# Patient Record
Sex: Female | Born: 1990 | Race: Black or African American | Hispanic: No | Marital: Single | Smoking: Never smoker
Health system: Southern US, Community
[De-identification: ages and names within clinical notes are randomized; demographics above are authoritative.]

---

## 2013-04-16 ENCOUNTER — Ambulatory Visit: Payer: Self-pay

## 2013-05-20 ENCOUNTER — Ambulatory Visit: Payer: Self-pay | Admitting: Internal Medicine

## 2014-02-01 ENCOUNTER — Ambulatory Visit (INDEPENDENT_AMBULATORY_CARE_PROVIDER_SITE_OTHER): Payer: Self-pay | Admitting: Emergency Medicine

## 2014-02-01 VITALS — BP 114/76 | HR 68 | Temp 98.0°F | Resp 18 | Ht 61.0 in | Wt 130.6 lb

## 2014-02-01 DIAGNOSIS — Z021 Encounter for pre-employment examination: Secondary | ICD-10-CM

## 2014-02-01 NOTE — Progress Notes (Signed)
[facility] [address], [place] [id number] [phone]  Date:  02/01/2014   Name:  Barbara EatonJustine Moody   DOB:  03/05/1990   MRN:  [id number]  PCP:  No PCP Per Patient    Chief Complaint: Annual Exam   History of Present Illness:  Barbara Moody is a 23 y.o. very pleasant female patient who presents with the following:  Admin physicial   There are no active problems to display for this patient.   History reviewed. No pertinent past medical history.  History reviewed. No pertinent past surgical history.  History  Substance Use Topics  . Smoking status: Never Smoker   . Smokeless tobacco: Never Used  . Alcohol Use: No    History reviewed. No pertinent family history.  No Known Allergies  Medication list has been reviewed and updated.  No current outpatient prescriptions on file prior to visit.   No current facility-administered medications on file prior to visit.    Review of Systems:  As per HPI, otherwise negative.    Physical Examination: Filed Vitals:   02/01/14 0929  BP: 114/76  Pulse: 68  Temp: 98 F (36.7 C)  Resp: 18   Filed Vitals:   02/01/14 0929  Height: 5\' 1"  (1.549 m)  Weight: 130 lb 9.6 oz (59.24 kg)   Body mass index is 24.69 kg/(m^2). Ideal Body Weight: Weight in (lb) to have BMI = 25: 132  GEN: WDWN, NAD, Non-toxic, A & O x 3 HEENT: Atraumatic, Normocephalic. Neck supple. No masses, No LAD. Ears and Nose: No external deformity. CV: RRR, No M/G/R. No JVD. No thrill. No extra heart sounds. PULM: CTA B, no wheezes, crackles, rhonchi. No retractions. No resp. distress. No accessory muscle use. ABD: S, NT, ND, +BS. No rebound. No HSM. EXTR: No c/c/e NEURO Normal gait.  PSYCH: Normally interactive. Conversant. Not depressed or anxious appearing.  Calm demeanor.    Assessment and Plan: DSS physical TBST  Signed,  Phillips OdorJeffery Edda Orea, MD

## 2014-02-01 NOTE — Addendum Note (Signed)
Addended by: Carmelina DaneANDERSON, Beyonca Wisz S on: 02/01/2014 10:12 AM   Modules accepted: Orders

## 2014-02-14 ENCOUNTER — Ambulatory Visit (INDEPENDENT_AMBULATORY_CARE_PROVIDER_SITE_OTHER): Payer: Self-pay | Admitting: Physician Assistant

## 2014-02-14 VITALS — Temp 98.7°F

## 2014-02-14 DIAGNOSIS — Z111 Encounter for screening for respiratory tuberculosis: Secondary | ICD-10-CM

## 2014-02-14 NOTE — Progress Notes (Signed)

## 2014-02-15 NOTE — Addendum Note (Signed)
Addended by: Morrell RiddleWEBER, Eann Cleland L on: 02/15/2014 08:38 AM   Modules accepted: Level of Service

## 2014-02-16 ENCOUNTER — Ambulatory Visit (INDEPENDENT_AMBULATORY_CARE_PROVIDER_SITE_OTHER): Payer: Self-pay

## 2014-02-16 DIAGNOSIS — Z7689 Persons encountering health services in other specified circumstances: Secondary | ICD-10-CM

## 2014-02-16 DIAGNOSIS — Z111 Encounter for screening for respiratory tuberculosis: Secondary | ICD-10-CM

## 2014-02-16 LAB — TB SKIN TEST
Induration: 0 mm
TB Skin Test: NEGATIVE

## 2015-01-04 ENCOUNTER — Ambulatory Visit (INDEPENDENT_AMBULATORY_CARE_PROVIDER_SITE_OTHER): Payer: BLUE CROSS/BLUE SHIELD | Admitting: Family Medicine

## 2015-01-04 ENCOUNTER — Ambulatory Visit (INDEPENDENT_AMBULATORY_CARE_PROVIDER_SITE_OTHER): Payer: BLUE CROSS/BLUE SHIELD

## 2015-01-04 VITALS — BP 118/80 | HR 68 | Temp 98.2°F | Resp 18 | Ht 62.0 in | Wt 140.0 lb

## 2015-01-04 DIAGNOSIS — R079 Chest pain, unspecified: Secondary | ICD-10-CM

## 2015-01-04 DIAGNOSIS — D509 Iron deficiency anemia, unspecified: Secondary | ICD-10-CM

## 2015-01-04 DIAGNOSIS — Z658 Other specified problems related to psychosocial circumstances: Secondary | ICD-10-CM | POA: Diagnosis not present

## 2015-01-04 DIAGNOSIS — F439 Reaction to severe stress, unspecified: Secondary | ICD-10-CM

## 2015-01-04 DIAGNOSIS — F411 Generalized anxiety disorder: Secondary | ICD-10-CM | POA: Diagnosis not present

## 2015-01-04 LAB — POCT CBC
Granulocyte percent: 57.5 %G (ref 37–80)
HCT, POC: 35.1 % — AB (ref 37.7–47.9)
HEMOGLOBIN: 11.6 g/dL — AB (ref 12.2–16.2)
Lymph, poc: 1.8 (ref 0.6–3.4)
MCH, POC: 24.9 pg — AB (ref 27–31.2)
MCHC: 33.2 g/dL (ref 31.8–35.4)
MCV: 75.1 fL — AB (ref 80–97)
MID (cbc): 0.6 (ref 0–0.9)
MPV: 7.5 fL (ref 0–99.8)
POC Granulocyte: 3.2 (ref 2–6.9)
POC LYMPH PERCENT: 32.1 %L (ref 10–50)
POC MID %: 10.4 %M (ref 0–12)
Platelet Count, POC: 172 10*3/uL (ref 142–424)
RBC: 4.67 M/uL (ref 4.04–5.48)
RDW, POC: 14.2 %
WBC: 5.5 10*3/uL (ref 4.6–10.2)

## 2015-01-04 IMAGING — CR DG CHEST 2V
2 series · 2 of 2 positions shown · non-contrast
Comparison: None.

CLINICAL DATA: Chest pain for 1-1/2 weeks. No known injury. Initial
encounter.

EXAM:
CHEST  2 VIEW

[PA]
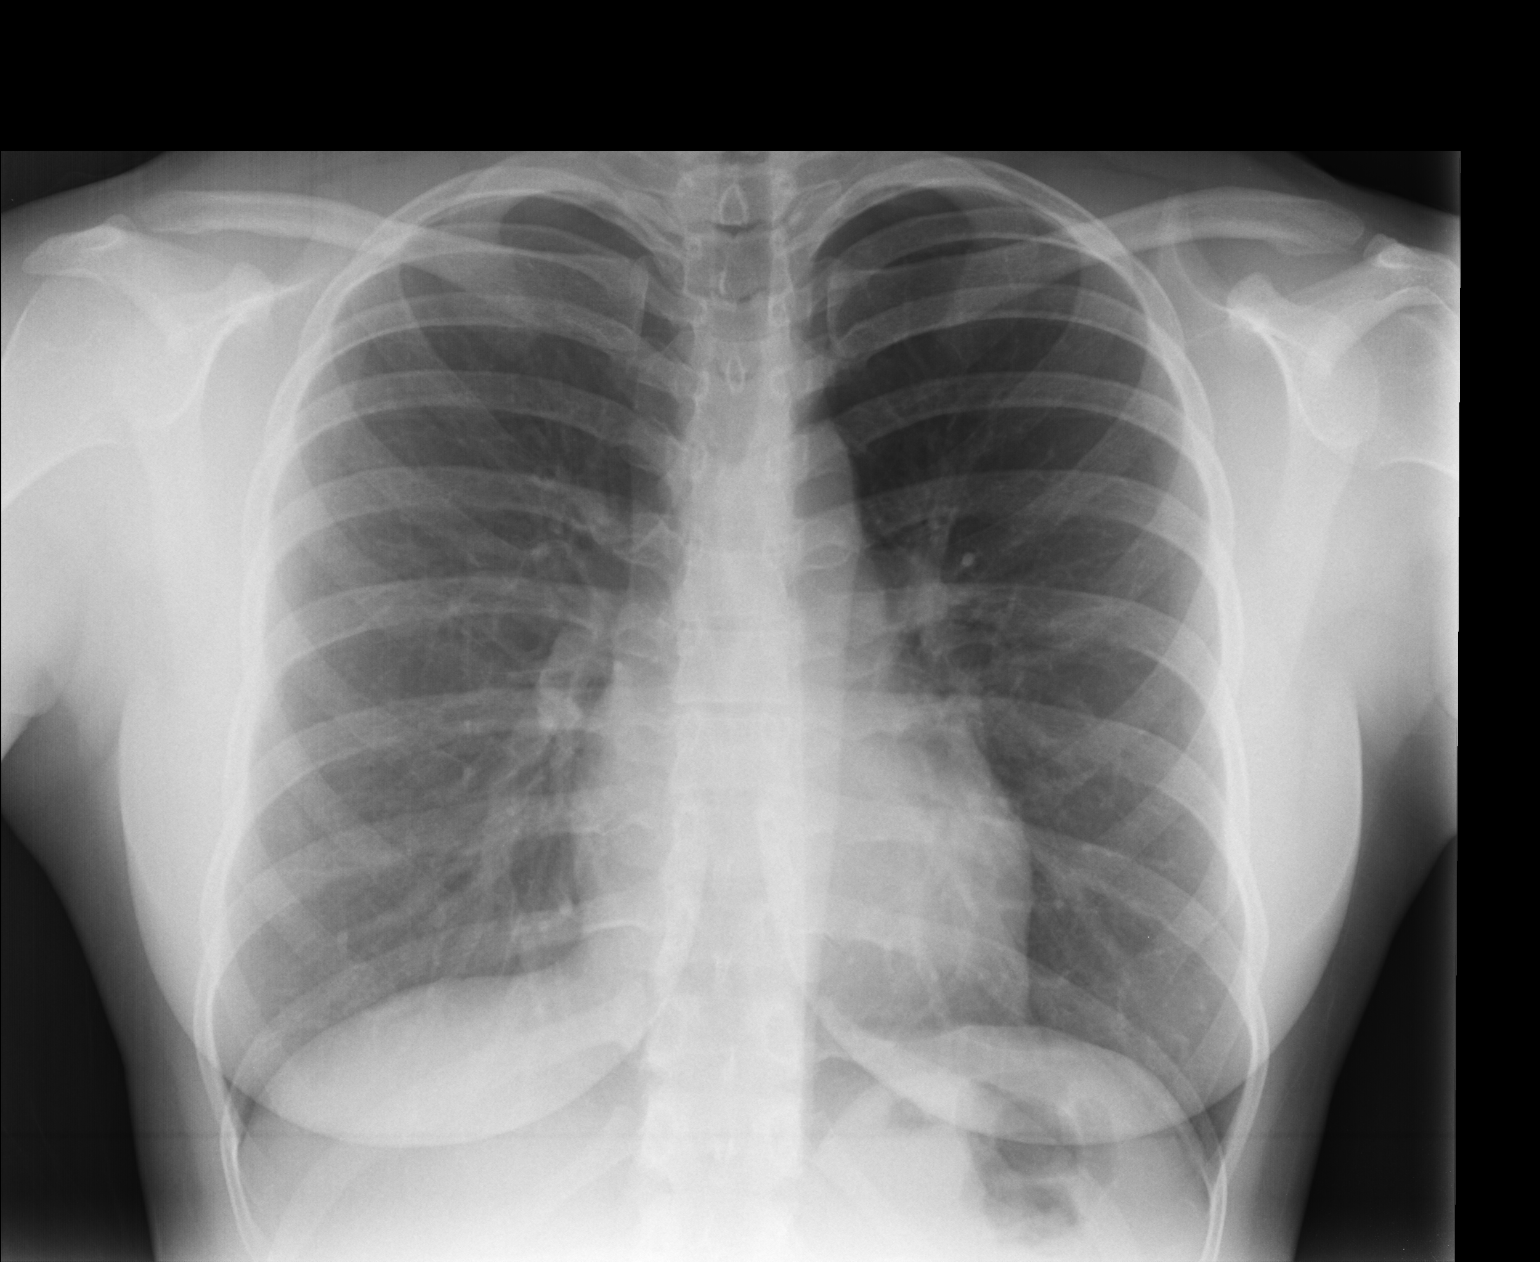

[lateral]
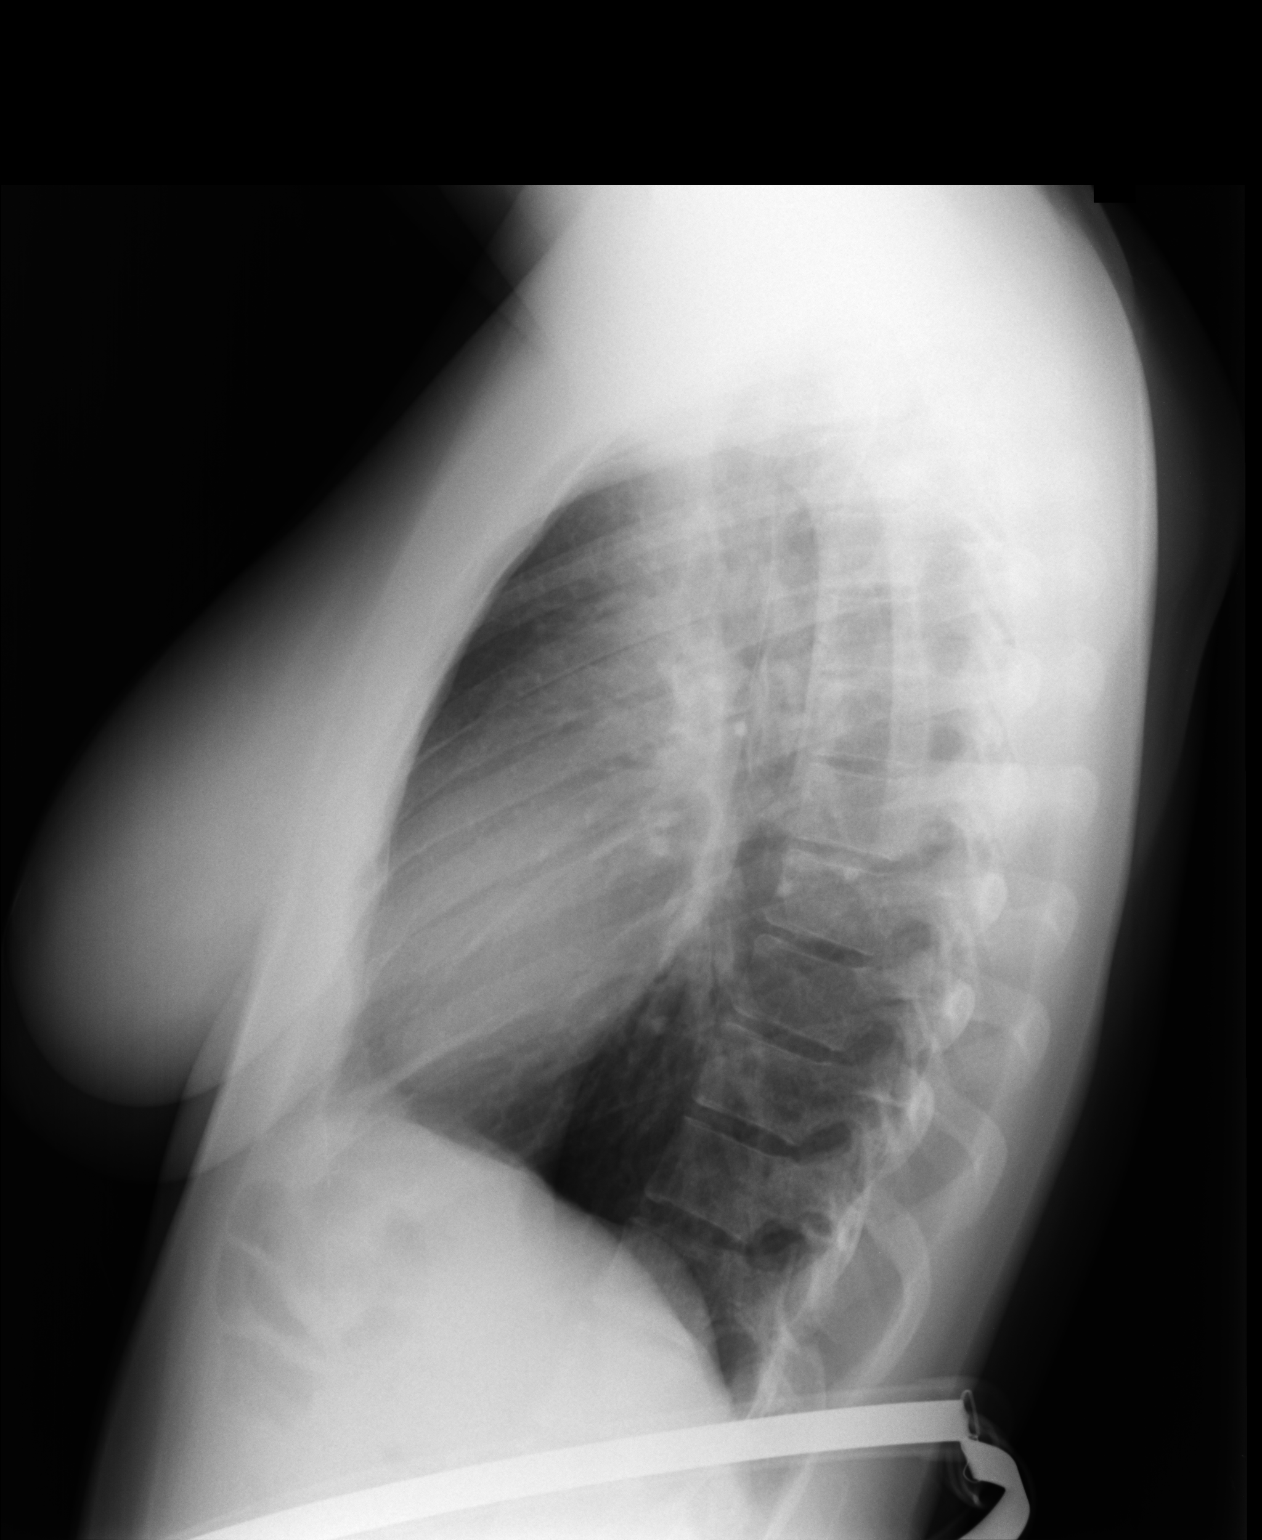

[2 of 2 positions shown; findings below may reference images not displayed]

FINDINGS: Heart size and mediastinal contours are within normal limits. Both
lungs are clear. Visualized skeletal structures are unremarkable.
IMPRESSION: Normal exam.

## 2015-01-04 MED ORDER — SERTRALINE HCL 50 MG PO TABS: 50.0000 mg | ORAL_TABLET | Freq: Every day | ORAL | Status: DC

## 2015-01-04 NOTE — Patient Instructions (Addendum)
Take iron over-the-counter one pill twice daily for 1 month, then daily for 1 month.  Begin sertraline 50 mg one daily for anxiety.  It takes about 2 weeks before you feel somewhat better.    Get regular exercise.    Speak to your job about the need for job /stress change.  I will give you 10 days off for the anxiety and stress and chest pain.  Urge you to plan on returning to the job at that time, and do not plan for me to extend it.  Return in 1 month for a re-assessment, sooner if problems.

## 2015-01-04 NOTE — Progress Notes (Signed)
Patient ID: Barbara Moody, female    DOB: [birth year]  Age: 24 y.o. MRN: [id number]  Chief Complaint  Patient presents with  . Chest Pain    about 1 1/2 weeks    Subjective:   Patient is here complaining of chest pain for the last couple weeks. She has been under a lot of stress at work. She works at [employer]. They are working long hours. 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 She is exhausted. She does cry according to her mother. She would like a little time off to resort things in life. She has tried to talk with her supervisors, but has not gotten any help. She has to do some lifting and moving of pallets and that causes her pain problem. She's been hurting in her chest wall. No shortness of breath. No nausea or vomiting.  Current allergies, medications, problem list, past/family and social histories reviewed.  Objective:  BP 118/80 mmHg  Pulse 68  Temp(Src) 98.2 F (36.8 C) (Oral)  Resp 18  Ht  (1.575 m)  Wt 140 lb (63.504 kg)  BMI 25.60 kg/m2  SpO2 98%  LMP 01/02/2015  Points above her left breast is area of most pain. Chest is clear to auscultation. Without thyromegaly. Throat clear. Heart regular without murmurs gallops or bruits. Chest wall is a little bit tender. No mass or tenderness. No axillary nodes. Upper motor strength is good. Results for orders placed or performed in visit on 01/04/15  POCT CBC  Result Value Ref Range   WBC 5.5 4.6 - 10.2 K/uL   Lymph, poc 1.8 0.6 - 3.4   POC LYMPH PERCENT 32.1 10 - 50 %L   MID (cbc) 0.6 0 - 0.9   POC MID % 10.4 0 - 12 %M   POC Granulocyte 3.2 2 - 6.9   Granulocyte percent 57.5 37 - 80 %G   RBC 4.67 4.04 - 5.48 M/uL   Hemoglobin 11.6 (A) 12.2 - 16.2 g/dL   HCT, POC 44.0 (A) 10.2 - 47.9 %   MCV 75.1 (A) 80 - 97 fL   MCH, POC 24.9 (A) 27 - 31.2 pg   MCHC 33.2 31.8 - 35.4 g/dL   RDW, POC 72.5 %   Platelet Count, POC 172 142 - 424 K/uL   MPV 7.5 0 - 99.8 fL   EKG normal. [facility] reading  (PRIMARY) by  Dr. Alwyn Ren Normal chest   Assessment & Plan:   Assessment: 1. Chest pain, unspecified chest pain type   2. Anemia, iron deficiency   3. Generalized anxiety disorder   4. Stress       Plan: Give her 10 days off to let her chest wall resting. Start her on a antianxiety/antidepressant SSRI. Return in about a month, but I think she needs to get back to work next week. She would like a couple weeks off but denied that.  Orders Placed This Encounter  Procedures  . DG Chest 2 View    Standing Status: Future     Number of Occurrences: 1     Standing Expiration Date: 01/04/2015    Order Specific Question:  Reason for Exam (SYMPTOM  OR DIAGNOSIS REQUIRED)    Answer:  chest pain    Order Specific Question:  Is the patient pregnant?    Answer:  No    Order Specific Question:  Preferred imaging location?    Answer:  External  . Iron and TIBC  .  POCT CBC  . EKG 12-Lead    Meds ordered this encounter  Medications  . sertraline (ZOLOFT) 50 MG tablet    Sig: Take 1 tablet (50 mg total) by mouth daily.    Dispense:  30 tablet    Refill:  2         Patient Instructions  Take iron over-the-counter one pill twice daily for 1 month, then daily for 1 month.  Begin sertraline 50 mg one daily for anxiety.  It takes about 2 weeks before you feel somewhat better.    Get regular exercise.    Speak to your job about the need for job /stress change.  I will give you 10 days off for the anxiety and stress and chest pain.  Urge you to plan on returning to the job at that time, and do not plan for me to extend it.  Return in 1 month for a re-assessment, sooner if problems.       Return in about 1 month (around 02/03/2015).   HOPPER,DAVID, MD 01/04/2015

## 2015-01-05 LAB — IRON AND TIBC
%SAT: 5 % — AB (ref 11–50)
Iron: 19 ug/dL — ABNORMAL LOW (ref 40–190)
TIBC: 347 ug/dL (ref 250–450)
UIBC: 328 ug/dL (ref 125–400)

## 2015-01-07 ENCOUNTER — Telehealth: Payer: Self-pay | Admitting: Family Medicine

## 2015-01-07 NOTE — Telephone Encounter (Signed)
[facility] attending physician statement. Patient is out of work for 2 weeks. Form goes into great detail about cognitive, emotional, and behavioral functioning. I am unable to answer these questions. I have placed the form in Dr. Frederik PearHopper's box for his completion.They also have requested OV notes from 01/04/2015 which I have printed and placed in the bottom drawer. Please [facility] at checkout when finished. Our department will [facility] this [facility].

## 2015-01-15 NOTE — Telephone Encounter (Signed)
Forms completed scanned in and faxed over with OV notes on 01/15/15.

## 2015-01-27 ENCOUNTER — Ambulatory Visit (INDEPENDENT_AMBULATORY_CARE_PROVIDER_SITE_OTHER): Payer: BLUE CROSS/BLUE SHIELD | Admitting: Family Medicine

## 2015-01-27 VITALS — BP 122/70 | HR 87 | Temp 98.6°F | Resp 14 | Ht 61.5 in | Wt 148.0 lb

## 2015-01-27 DIAGNOSIS — D509 Iron deficiency anemia, unspecified: Secondary | ICD-10-CM

## 2015-01-27 DIAGNOSIS — Z566 Other physical and mental strain related to work: Secondary | ICD-10-CM

## 2015-01-27 DIAGNOSIS — R079 Chest pain, unspecified: Secondary | ICD-10-CM

## 2015-01-27 NOTE — Patient Instructions (Signed)
Nonspecific Chest Pain  °Chest pain can be caused by many different conditions. There is always a chance that your pain could be related to something serious, such as a heart attack or a blood clot in your lungs. Chest pain can also be caused by conditions that are not life-threatening. If you have chest pain, it is very important to follow up with your health care provider. °CAUSES  °Chest pain can be caused by: °· Heartburn. °· Pneumonia or bronchitis. °· Anxiety or stress. °· Inflammation around your heart (pericarditis) or lung (pleuritis or pleurisy). °· A blood clot in your lung. °· A collapsed lung (pneumothorax). It can develop suddenly on its own (spontaneous pneumothorax) or from trauma to the chest. °· Shingles infection (varicella-zoster virus). °· Heart attack. °· Damage to the bones, muscles, and cartilage that make up your chest wall. This can include: °¨ Bruised bones due to injury. °¨ Strained muscles or cartilage due to frequent or repeated coughing or overwork. °¨ Fracture to one or more ribs. °¨ Sore cartilage due to inflammation (costochondritis). °RISK FACTORS  °Risk factors for chest pain may include: °· Activities that increase your risk for trauma or injury to your chest. °· Respiratory infections or conditions that cause frequent coughing. °· Medical conditions or overeating that can cause heartburn. °· Heart disease or family history of heart disease. °· Conditions or health behaviors that increase your risk of developing a blood clot. °· Having had chicken pox (varicella zoster). °SIGNS AND SYMPTOMS °Chest pain can feel like: °· Burning or tingling on the surface of your chest or deep in your chest. °· Crushing, pressure, aching, or squeezing pain. °· Dull or sharp pain that is worse when you move, cough, or take a deep breath. °· Pain that is also felt in your back, neck, shoulder, or arm, or pain that spreads to any of these areas. °Your chest pain may come and go, or it may stay  constant. °DIAGNOSIS °Lab tests or other studies may be needed to find the cause of your pain. Your health care provider may have you take a test called an ambulatory ECG (electrocardiogram). An ECG records your heartbeat patterns at the time the test is performed. You may also have other tests, such as: °· Transthoracic echocardiogram (TTE). During echocardiography, sound waves are used to create a picture of all of the heart structures and to look at how blood flows through your heart. °· Transesophageal echocardiogram (TEE). This is a more advanced imaging test that obtains images from inside your body. It allows your health care provider to see your heart in finer detail. °· Cardiac monitoring. This allows your health care provider to monitor your heart rate and rhythm in real time. °· Holter monitor. This is a portable device that records your heartbeat and can help to diagnose abnormal heartbeats. It allows your health care provider to track your heart activity for several days, if needed. °· Stress tests. These can be done through exercise or by taking medicine that makes your heart beat more quickly. °· Blood tests. °· Imaging tests. °TREATMENT  °Your treatment depends on what is causing your chest pain. Treatment may include: °· Medicines. These may include: °¨ Acid blockers for heartburn. °¨ Anti-inflammatory medicine. °¨ Pain medicine for inflammatory conditions. °¨ Antibiotic medicine, if an infection is present. °¨ Medicines to dissolve blood clots. °¨ Medicines to treat coronary artery disease. °· Supportive care for conditions that do not require medicines. This may include: °¨ Resting. °¨ Applying heat   or cold packs to injured areas. °¨ Limiting activities until pain decreases. °HOME CARE INSTRUCTIONS °· If you were prescribed an antibiotic medicine, finish it all even if you start to feel better. °· Avoid any activities that bring on chest pain. °· Do not use any tobacco products, including  cigarettes, chewing tobacco, or electronic cigarettes. If you need help quitting, ask your health care provider. °· Do not drink alcohol. °· Take medicines only as directed by your health care provider. °· Keep all follow-up visits as directed by your health care provider. This is important. This includes any further testing if your chest pain does not go away. °· If heartburn is the cause for your chest pain, you may be told to keep your head raised (elevated) while sleeping. This reduces the chance that acid will go from your stomach into your esophagus. °· Make lifestyle changes as directed by your health care provider. These may include: °¨ Getting regular exercise. Ask your health care provider to suggest some activities that are safe for you. °¨ Eating a heart-healthy diet. A registered dietitian can help you to learn healthy eating options. °¨ Maintaining a healthy weight. °¨ Managing diabetes, if necessary. °¨ Reducing stress. °SEEK MEDICAL CARE IF: °· Your chest pain does not go away after treatment. °· You have a rash with blisters on your chest. °· You have a fever. °SEEK IMMEDIATE MEDICAL CARE IF:  °· Your chest pain is worse. °· You have an increasing cough, or you cough up blood. °· You have severe abdominal pain. °· You have severe weakness. °· You faint. °· You have chills. °· You have sudden, unexplained chest discomfort. °· You have sudden, unexplained discomfort in your arms, back, neck, or jaw. °· You have shortness of breath at any time. °· You suddenly start to sweat, or your skin gets clammy. °· You feel nauseous or you vomit. °· You suddenly feel light-headed or dizzy. °· Your heart begins to beat quickly, or it feels like it is skipping beats. °These symptoms may represent a serious problem that is an emergency. Do not wait to see if the symptoms will go away. Get medical help right away. Call your local emergency services (911 in the [place] Do not drive yourself to the hospital. °  °This  information is not intended to replace advice given to you by your health care provider. Make sure you discuss any questions you have with your health care provider. °  °Document Released: 11/23/2004 Document Revised: 03/06/2014 Document Reviewed: 09/19/2013 °Elsevier Interactive Patient Education ©2016 [employer] Inc. ° °

## 2015-01-27 NOTE — Progress Notes (Signed)
Chief Complaint:  Chief Complaint  Patient presents with  . Follow-up    chest pain    HPI: Barbara Moody is a 24 y.o. female who reports to [facility] today complaining of  1 month Chest and palpitation at night , no other sxs ie diaphoresis, vision changes, nausea vomiting abd pain , n/t only slightly weak when she stands up. She is catching up on her coursework at night, she is taking classes human, health and math, she is getting a degree on online in psychology .  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 She started feeling these sxs at that time. She   She has no family hx of MI/CVA Her mom is dependent on her as primary health provider She has stress at work, worked a 10 day stretch and today is the only off day and this included thanskgiving day and is/was stressful, She works about 12-14 hour days then comes home to do school work, she spends about 10 hrs doing online coursework per week.  She is not bothered by the school work, it is not added stress, She is in Insurance account manager at [employer], she states that all the freight read, managers yell at you and customers yell at you and managing people has been stressful. She denies SI HI or hallucinations.   She was off for 10 days and still  Had CP but less, she had CP on Monday midepigastric and substernal area. She feels a tightness, She has throbbing tightness, it lasts for about 10 min and then goes away byu itself after sitting down. She thinks sometimes helped by exertion, sometimes by itself.  She is a nonsmoker, she denies DM or high cholesterol. No known chest wall injury   History reviewed. No pertinent past medical history. History reviewed. No pertinent past surgical history. Social History   Social History  . Marital Status: Single    Spouse Name: N/A  . Number of Children: N/A  . Years of Education: N/A   Social History Main Topics  . Smoking status: Never Smoker   . Smokeless tobacco: Never Used  . Alcohol Use: No   . Drug Use: No  . Sexual Activity: Not Asked   Other Topics Concern  . None   Social History Narrative   History reviewed. No pertinent family history. No Known Allergies Prior to Admission medications   Medication Sig Start Date End Date Taking? Authorizing Provider  sertraline (ZOLOFT) 50 MG tablet Take 1 tablet (50 mg total) by mouth daily. 01/04/15  Yes Peyton Najjar, MD     ROS: The patient denies fevers, chills, night sweats, unintentional weight loss, wheezing, dyspnea on exertion, nausea, vomiting, abdominal pain, dysuria, hematuria, melena, numbness, weakness, or tingling.   All other systems have been reviewed and were otherwise negative with the exception of those mentioned in the HPI and as above.    PHYSICAL EXAM: Filed Vitals:   01/27/15 1520  BP: 122/70  Pulse: 87  Temp: 98.6 F (37 C)  Resp: 14   Body mass index is 27.51 kg/(m^2).   General: Alert, no acute distress HEENT:  Normocephalic, atraumatic, oropharynx patent. EOMI, PERRLA Cardiovascular:  Regular rate and rhythm, no rubs murmurs or gallops.  No Carotid bruits, radial pulse intact. No pedal edema.  Respiratory: Clear to auscultation bilaterally.  No wheezes, rales, or rhonchi.  No cyanosis, no use of accessory musculature Abdominal: No organomegaly, abdomen is soft and non-tender, positive bowel sounds. No masses.  Skin: No rashes. Neurologic: Facial musculature symmetric. Psychiatric: Patient acts appropriately throughout our interaction. Lymphatic: No cervical or submandibular lymphadenopathy Musculoskeletal: Gait intact. No edema, tenderness   LABS: Results for orders placed or performed in visit on 01/04/15  Iron and TIBC  Result Value Ref Range   Iron 19 (L) 40 - 190 ug/dL   UIBC 045328 409125 - 811400 ug/dL   TIBC 914347 782250 - 956450 ug/dL   %SAT 5 (L) 11 - 50 %  POCT CBC  Result Value Ref Range   WBC 5.5 4.6 - 10.2 K/uL   Lymph, poc 1.8 0.6 - 3.4   POC LYMPH PERCENT 32.1 10 - 50 %L   MID  (cbc) 0.6 0 - 0.9   POC MID % 10.4 0 - 12 %M   POC Granulocyte 3.2 2 - 6.9   Granulocyte percent 57.5 37 - 80 %G   RBC 4.67 4.04 - 5.48 M/uL   Hemoglobin 11.6 (A) 12.2 - 16.2 g/dL   HCT, POC 21.335.1 (A) 08.637.7 - 47.9 %   MCV 75.1 (A) 80 - 97 fL   MCH, POC 24.9 (A) 27 - 31.2 pg   MCHC 33.2 31.8 - 35.4 g/dL   RDW, POC 57.814.2 %   Platelet Count, POC 172 142 - 424 K/uL   MPV 7.5 0 - 99.8 fL     EKG/XRAY:   [facility]   ASSESSMENT/PLAN: Encounter Diagnoses  Name Primary?  . Chest pain, unspecified chest pain type Yes  . Stressful job   . Anemia, iron deficiency    She is doing well, still had some similar sxs of CP which I think is related to job stress more than anything else. She declines any additional labs  I will go ahead a refer her to cardiology since she declines further workup and cont to have similar persistent CP  She was given precautions to go to ER if she has worsening sxs.  She was given other options for meds ie vistaril. She declines anything. The zoloft does not seem to make a difference at this point but she has only  Been on it  for a couple of weeks.  I have recommended that since she is not considering anything other treatment options we will cont to monitor on the zoloft. Fu in 1 month  Take iron supplements     Gross sideeffects, risk and benefits, and alternatives of medications d/w patient. Patient is aware that all medications have potential sideeffects and we are unable to predict every sideeffect or drug-drug interaction that may occur.  Sharron Petruska DO  01/27/2015 4:40 PM

## 2015-02-02 ENCOUNTER — Telehealth: Payer: Self-pay

## 2015-02-02 NOTE — Telephone Encounter (Signed)
Patient brought in a paper that needs to be completed by Dr. Alwyn RenHopper and returned to the patient before 02/03/15. I told the patient we would try to get it signed and filled out in time but couldn't promise anything. I have highlighted the areas that need to be completed and placed in Dr. Frederik PearHopper's box on 02/02/15 Please fill this out sign it and make a [facility]. Thank you!

## 2015-07-17 ENCOUNTER — Other Ambulatory Visit: Payer: Self-pay | Admitting: Family Medicine

## 2015-07-23 ENCOUNTER — Other Ambulatory Visit: Payer: Self-pay | Admitting: Family Medicine

## 2015-09-20 ENCOUNTER — Ambulatory Visit (INDEPENDENT_AMBULATORY_CARE_PROVIDER_SITE_OTHER): Payer: BLUE CROSS/BLUE SHIELD | Admitting: Family Medicine

## 2015-09-20 VITALS — BP 120/78 | HR 72 | Temp 98.8°F | Resp 18 | Ht 61.5 in | Wt 143.0 lb

## 2015-09-20 DIAGNOSIS — Z124 Encounter for screening for malignant neoplasm of cervix: Secondary | ICD-10-CM

## 2015-09-20 DIAGNOSIS — N898 Other specified noninflammatory disorders of vagina: Secondary | ICD-10-CM | POA: Diagnosis not present

## 2015-09-20 DIAGNOSIS — B009 Herpesviral infection, unspecified: Secondary | ICD-10-CM | POA: Diagnosis not present

## 2015-09-20 LAB — POCT WET + KOH PREP
TRICH BY WET PREP: ABSENT
YEAST BY KOH: ABSENT
Yeast by wet prep: ABSENT

## 2015-09-20 MED ORDER — VALACYCLOVIR HCL 500 MG PO TABS: 500.0000 mg | ORAL_TABLET | Freq: Every day | ORAL | 4 refills | Status: DC

## 2015-09-20 NOTE — Patient Instructions (Addendum)
IF you received an x-ray today, you will receive an invoice from [facility]. Please contact [facility] at [phone] with questions or concerns regarding your invoice.   IF you received labwork today, you will receive an invoice from [employer]. Please contact [facility] at [phone] with questions or concerns regarding your invoice.   Our billing staff will not be able to assist you with questions regarding bills from these companies.  You will be contacted with the lab results as soon as they are available. The fastest way to get your results is to activate your My Chart account. Instructions are located on the last page of this paperwork. If you have not heard from [place] regarding the results in 2 weeks, please contact this office.    Contraception Choices Contraception (birth control) is the use of any methods or devices to prevent pregnancy. Below are some methods to help avoid pregnancy. HORMONAL METHODS   Contraceptive implant. This is a thin, plastic tube containing progesterone hormone. It does not contain estrogen hormone. Your health care provider inserts the tube in the inner part of the upper arm. The tube can remain in place for up to 3 years. After 3 years, the implant must be removed. The implant prevents the ovaries from releasing an egg (ovulation), thickens the cervical mucus to prevent sperm from entering the uterus, and thins the lining of the inside of the uterus.  Progesterone-only injections. These injections are given every 3 months by your health care provider to prevent pregnancy. This synthetic progesterone hormone stops the ovaries from releasing eggs. It also thickens cervical mucus and changes the uterine lining. This makes it harder for sperm to survive in the uterus.  Birth control pills. These pills contain estrogen and progesterone hormone. They work by preventing the ovaries from releasing eggs (ovulation).  They also cause the cervical mucus to thicken, preventing the sperm from entering the uterus. Birth control pills are prescribed by a health care provider.Birth control pills can also be used to treat heavy periods.  Minipill. This type of birth control pill contains only the progesterone hormone. They are taken every day of each month and must be prescribed by your health care provider.  Birth control patch. The patch contains hormones similar to those in birth control pills. It must be changed once a week and is prescribed by a health care provider.  Vaginal ring. The ring contains hormones similar to those in birth control pills. It is left in the vagina for 3 weeks, removed for 1 week, and then a new one is put back in place. The patient must be comfortable inserting and removing the ring from the vagina.A health care provider's prescription is necessary.  Emergency contraception. Emergency contraceptives prevent pregnancy after unprotected sexual intercourse. This pill can be taken right after sex or up to 5 days after unprotected sex. It is most effective the sooner you take the pills after having sexual intercourse. Most emergency contraceptive pills are available without a prescription. Check with your pharmacist. Do not use emergency contraception as your only form of birth control. BARRIER METHODS   Female condom. This is a thin sheath (latex or rubber) that is worn over the penis during sexual intercourse. It can be used with spermicide to increase effectiveness.  Female condom. This is a soft, loose-fitting sheath that is put into the vagina before sexual intercourse.  Diaphragm. This is a soft, latex, dome-shaped barrier that must be fitted by a  health care provider. It is inserted into the vagina, along with a spermicidal jelly. It is inserted before intercourse. The diaphragm should be left in the vagina for 6 to 8 hours after intercourse.  Cervical cap. This is a round, soft, latex  or plastic cup that fits over the cervix and must be fitted by a health care provider. The cap can be left in place for up to 48 hours after intercourse.  Sponge. This is a soft, circular piece of polyurethane foam. The sponge has spermicide in it. It is inserted into the vagina after wetting it and before sexual intercourse.  Spermicides. These are chemicals that kill or block sperm from entering the cervix and uterus. They come in the form of creams, jellies, suppositories, foam, or tablets. They do not require a prescription. They are inserted into the vagina with an applicator before having sexual intercourse. The process must be repeated every time you have sexual intercourse. INTRAUTERINE CONTRACEPTION  Intrauterine device (IUD). This is a T-shaped device that is put in a woman's uterus during a menstrual period to prevent pregnancy. There are 2 types:  Copper IUD. This type of IUD is wrapped in copper wire and is placed inside the uterus. Copper makes the uterus and fallopian tubes produce a fluid that kills sperm. It can stay in place for 10 years.  Hormone IUD. This type of IUD contains the hormone progestin (synthetic progesterone). The hormone thickens the cervical mucus and prevents sperm from entering the uterus, and it also thins the uterine lining to prevent implantation of a fertilized egg. The hormone can weaken or kill the sperm that get into the uterus. It can stay in place for 3-5 years, depending on which type of IUD is used. PERMANENT METHODS OF CONTRACEPTION  Female tubal ligation. This is when the woman's fallopian tubes are surgically sealed, tied, or blocked to prevent the egg from traveling to the uterus.  Hysteroscopic sterilization. This involves placing a small coil or insert into each fallopian tube. Your doctor uses a technique called hysteroscopy to do the procedure. The device causes scar tissue to form. This results in permanent blockage of the fallopian tubes, so  the sperm cannot fertilize the egg. It takes about 3 months after the procedure for the tubes to become blocked. You must use another form of birth control for these 3 months.  Female sterilization. This is when the female has the tubes that carry sperm tied off (vasectomy).This blocks sperm from entering the vagina during sexual intercourse. After the procedure, the man can still ejaculate fluid (semen). NATURAL PLANNING METHODS  Natural family planning. This is not having sexual intercourse or using a barrier method (condom, diaphragm, cervical cap) on days the woman could become pregnant.  Calendar method. This is keeping track of the length of each menstrual cycle and identifying when you are fertile.  Ovulation method. This is avoiding sexual intercourse during ovulation.  Symptothermal method. This is avoiding sexual intercourse during ovulation, using a thermometer and ovulation symptoms.  Post-ovulation method. This is timing sexual intercourse after you have ovulated. Regardless of which type or method of contraception you choose, it is important that you use condoms to protect against the transmission of sexually transmitted infections (STIs). Talk with your health care provider about which form of contraception is most appropriate for you.   This information is not intended to replace advice given to you by your health care provider. Make sure you discuss any questions you have with your  health care provider.   Document Released: 02/13/2005 Document Revised: 02/18/2013 Document Reviewed: 08/08/2012 Elsevier Interactive Patient Education 2016 Elsevier Inc.  Natural Family Planning Natural Family Planning (NFP) is a type of birth control without using any form of contraception. Women who use NFP should not have sexual intercourse when the ovary produces an egg (ovulation) during the menstrual cycle. The NFP method is safe and can prevent pregnancy. It is 75% effective when practiced  right. The man needs to also understand this method of birth control and the woman needs to be aware of how her body functions during her menstrual cycle. NFP can also be used as a method of getting pregnant.  HOW THE NFP METHOD WORKS  A woman's menstrual period usually happens every 28-30 days (it can vary from 23-35 days).  Ovulation happens 12-14 days before the start of the next menstrual period (the fertile period). The egg is fertile for 24 hours and the sperm can live for 3 days or more. If there is sexual intercourse at this time, pregnancy can occur. THERE ARE MANY TYPES OF NFP METHODS USED TO PREVENT PREGNANCY  The basal body temperature method. Often times, there is a slight increase of body temperature when a woman ovulates. Take your temperature every morning before getting out of bed. Write the temperature on a chart. An increase in the temperature shows ovulation has happened. Do not have sexual intercourse from the menstrual period up to three days after the increase in the temperature. Note that the body temperature may increase as a result of fever, restless sleep, and working schedules.  The ovulation cervical mucus method. During the menstrual cycle, the cervical mucus changes from dry and sticky to wet and slippery. Check the mucus of the vagina every day to look for these changes. Just before ovulation, the mucus becomes wet and slippery. On the last day of wetness, ovulation happens. To avoid getting pregnant, sexual intercourse is safe for about 10 days after the menstrual period and on the dry mucus days. Do not have sexual intercourse when the mucus starts to show up and not until 4 days after the wet and slippery mucus goes away. Sexual intercourse after the 4 days have passed until the menstrual period starts is a safe time. Note that the mucus from the vagina can increase because of a vaginal or cervical infection, lubricants, some medicines, and sexual excitement.  The  symptothermal method. This method uses both the temperature and the ovulation methods. Combine the two methods above to prevent pregnancy.  The calendar method. Record your menstrual periods and length of the cycles for 6 months. This is helpful when the menstrual cycle varies in the length of the cycle. The length of a menstrual cycle is from day 1 of the present menstrual period to day 1 of the next menstrual period. Then, find your fertile days of the month and do not have sexual intercourse during that time. You may need help from your health care provider to find out your fertile days. There are some signs of ovulation that may be helpful when trying to find the time of ovulation. This includes vaginal spotting or abdominal cramps during the middle of your menstrual cycle. Not all women have these symptoms. YOU SHOULD NOT USE NFP IF:  You have very irregular menstrual periods and may skip months.  You have abnormal bleeding.  You have a vaginal or cervical infection.  You are on medicines that can affect the vaginal mucus or  body temperature. These medicines include antibiotics, thyroid medicines, and antihistamines (cold and allergy medicine).   This information is not intended to replace advice given to you by your health care provider. Make sure you discuss any questions you have with your health care provider.   Document Released: 08/02/2007 Document Revised: 02/18/2013 Document Reviewed: 08/16/2012 Elsevier Interactive Patient Education [employer]

## 2015-09-20 NOTE — Progress Notes (Addendum)
Subjective:  By signing my name below, I, Stann Ore, attest that this documentation has been prepared under the direction and in the presence of Norberto Sorenson, MD. Electronically Signed: Stann Ore, Scribe. 09/20/2015 , 10:29 PM .  Patient was seen in Room 12 .   Patient ID: Barbara Moody, female    DOB: January 19, 1991, 25 y.o.   MRN: [id number] Chief Complaint  Patient presents with  . Gynecologic Exam   HPI Kalynn Caldeira is a 25 y.o. female who presents to [facility] requesting for a gynecologic exam.  Was diagnosed with herpes around 2013 - is having outbreaks about 4x/yr and would like to start on suppressive therapy. Is not on birth control and has never been on any birth control in the past. She is not using condoms. She is starting to think about having a family in about 6 months or so.  Has never had an abnormal pap smear prior and her last was a few years ago.  She thinks she had the HPV vaccines in high school but not problems. Normal GI/GU. Periods last for about 5-6 days and medium flow. Used tampons. Has been with current partner for 4 years.    No past medical history on file. Prior to Admission medications   Medication Sig Start Date End Date Taking? Authorizing Provider  sertraline (ZOLOFT) 50 MG tablet Take 1 tablet (50 mg total) by mouth daily. 01/04/15  Yes Peyton Najjar, MD   No Known Allergies  Review of Systems     Objective:   Physical Exam  Constitutional: She is oriented to person, place, and time. She appears well-developed and well-nourished. No distress.  HENT:  Head: Normocephalic and atraumatic.  Eyes: EOM are normal. Pupils are equal, round, and reactive to light.  Neck: Neck supple.  Cardiovascular: Normal rate.   Pulmonary/Chest: Effort normal. No respiratory distress.  Musculoskeletal: Normal range of motion.  Neurological: She is alert and oriented to person, place, and time.  Skin: Skin is warm and dry.  Psychiatric: She has a normal mood and  affect. Her behavior is normal.  Nursing note and vitals reviewed.   BP 120/78   Pulse 72   Temp 98.8 F (37.1 C) (Oral)   Resp 18   Ht 5' 1.5" (1.562 m)   Wt 143 lb (64.9 kg)   LMP 08/30/2015   SpO2 100%   BMI 26.58 kg/m     Results for orders placed or performed in visit on 09/20/15  POCT Wet + KOH Prep  Result Value Ref Range   Yeast by KOH Absent Present, Absent   Yeast by wet prep Absent Present, Absent   WBC by wet prep Moderate (A) None, Few, Too numerous to count   Clue Cells Wet Prep HPF POC None None, Too numerous to count   Trich by wet prep Absent Present, Absent   Bacteria Wet Prep HPF POC Moderate (A) None, Few, Too numerous to count   Epithelial Cells By [employer] ([facility]) Few None, Few, Too numerous to count   RBC,UR,HPF,POC None None RBC/hpf    Assessment & Plan:   1. Encounter for screening for cervical cancer    2. Vaginal discharge  - seen on exam, pt w/ no complaints  3. Herpes simplex  - start suppressive treatment due to pt preference  Pt declines STI screening or birth control.   In monogamous relationship x 4 yrs and thinks she might want to conceive in 6 mos or so. Advised condoms  every time.  Orders Placed This Encounter  Procedures  . POCT Wet + KOH Prep    Meds ordered this encounter  Medications  . valACYclovir (VALTREX) 500 MG tablet    Sig: Take 1 tablet (500 mg total) by mouth daily. Increase to twice a day for 5 days at first sign of an outbreak    Dispense:  90 tablet    Refill:  4      Norberto Sorenson, M.D.  [facility] [address] [place], [place] [address] [phone] phone [phone] fax  09/20/15 10:29 PM

## 2015-09-22 LAB — PAP IG W/ RFLX HPV ASCU

## 2015-10-11 ENCOUNTER — Other Ambulatory Visit: Payer: Self-pay | Admitting: Family Medicine

## 2015-10-11 DIAGNOSIS — F411 Generalized anxiety disorder: Secondary | ICD-10-CM

## 2016-01-14 ENCOUNTER — Encounter: Payer: Self-pay | Admitting: Physician Assistant

## 2016-01-14 ENCOUNTER — Ambulatory Visit (INDEPENDENT_AMBULATORY_CARE_PROVIDER_SITE_OTHER): Payer: BLUE CROSS/BLUE SHIELD | Admitting: Physician Assistant

## 2016-01-14 VITALS — BP 114/76 | HR 78 | Temp 98.2°F | Resp 16 | Ht 61.5 in | Wt 140.0 lb

## 2016-01-14 DIAGNOSIS — N390 Urinary tract infection, site not specified: Secondary | ICD-10-CM

## 2016-01-14 DIAGNOSIS — R319 Hematuria, unspecified: Secondary | ICD-10-CM

## 2016-01-14 DIAGNOSIS — R300 Dysuria: Secondary | ICD-10-CM | POA: Diagnosis not present

## 2016-01-14 LAB — POCT URINALYSIS DIP (MANUAL ENTRY)
BILIRUBIN UA: NEGATIVE
NITRITE UA: POSITIVE — AB
PH UA: 6.5
Protein Ur, POC: 100 — AB
Spec Grav, UA: 1.01
Urobilinogen, UA: 2

## 2016-01-14 LAB — POC MICROSCOPIC URINALYSIS (UMFC): MUCUS RE: ABSENT

## 2016-01-14 MED ORDER — NITROFURANTOIN MONOHYD MACRO 100 MG PO CAPS: 100.0000 mg | ORAL_CAPSULE | Freq: Two times a day (BID) | ORAL | 0 refills | Status: DC

## 2016-01-14 NOTE — Progress Notes (Signed)
01/14/2016 at 2:52 PM  Barbara Moody / DOB: 08/26/1990 / MRN: [id number]  The patient  does not have a problem list on file.  SUBJECTIVE  Barbara Moody is a 25 y.o. female who complains of dysuria, urinary frequency and urinary hesitancy x 3 days. She denies hematuria, flank pain, abdominal pain, pelvic pain, cloudy malordorous urine, genital rash, genital irritation and vaginal discharge. Has tried azo and advil with mild relief. Most recent UTI prior to this was 3 years ago.   She  has no past medical history on file.    Medications reviewed and updated by myself where necessary, and exist elsewhere in the encounter.   Barbara Moody has No Known Allergies. She  reports that she has never smoked. She has never used smokeless tobacco. She reports that she does not drink alcohol or use drugs. She  has no sexual activity history on file. The patient  has no past surgical history on file.  Her family history is not on file.  Review of Systems  Constitutional: Negative for chills, diaphoresis and fever.  Gastrointestinal: Negative for nausea and vomiting.    OBJECTIVE  Her  height is 5' 1.5" (1.562 m) and weight is 140 lb (63.5 kg). Her oral temperature is 98.2 F (36.8 C). Her blood pressure is 114/76 and her pulse is 78. Her respiration is 16 and oxygen saturation is 99%.  The patient's body mass index is 26.02 kg/m.  Physical Exam  Constitutional: She is oriented to person, place, and time. She appears well-developed and well-nourished.  HENT:  Head: Normocephalic.  Neck: Normal range of motion.  Respiratory: Effort normal.  GI: Soft. Normal appearance and bowel sounds are normal. There is no tenderness. There is no CVA tenderness.  Neurological: She is alert and oriented to person, place, and time.  Skin: Skin is warm and dry.  Psychiatric: She has a normal mood and affect.    Results for orders placed or performed in visit on 01/14/16 (from the past 24 hour(s))  POCT  Microscopic Urinalysis ([facility])     Status: Abnormal   Collection Time: 01/14/16  2:38 PM  Result Value Ref Range   WBC,UR,HPF,POC Too numerous to count  (A) None WBC/hpf   RBC,UR,HPF,POC Many (A) None RBC/hpf   Bacteria Many (A) None, Too numerous to count   Mucus Absent Absent   Epithelial Cells, UR Per Microscopy Many (A) None, Too numerous to count cells/hpf  POCT urinalysis dipstick     Status: Abnormal   Collection Time: 01/14/16  2:39 PM  Result Value Ref Range   Color, UA orange (A) yellow   Clarity, UA cloudy (A) clear   Glucose, UA =100 (A) negative   Bilirubin, UA negative negative   Ketones, POC UA trace (5) (A) negative   Spec Grav, UA 1.010    Blood, UA large (A) negative   pH, UA 6.5    Protein Ur, POC =100 (A) negative   Urobilinogen, UA 2.0    Nitrite, UA Positive (A) Negative   Leukocytes, UA large (3+) (A) Negative    ASSESSMENT & PLAN  Barbara Moody was seen today for dysuria.  Diagnoses and all orders for this visit:  Dysuria -     POCT Microscopic Urinalysis ([facility]) -     POCT urinalysis dipstick -     Urine culture  Urinary tract infection with hematuria, site unspecified -     nitrofurantoin, macrocrystal-monohydrate, (MACROBID) 100 MG capsule; Take 1 capsule (100 mg total) by  mouth 2 (two) times daily.    The patient was advised to call or come back to clinic if she does not see an improvement in symptoms, or worsens with the above plan.   Benjiman CoreBrittany Bert Ptacek, PA-C [facility] 01/14/2016 2:52 PM

## 2016-01-14 NOTE — Patient Instructions (Addendum)
Take antibiotic as prescribed. We will contact you with your urine culture results. If symptoms do not improve with antibiotics, come back. If you develop fever, chills, or back pain come back sooner.    Acute Urinary Retention, Female Urinary retention means you are unable to pee completely or at all (empty your bladder). Follow these instructions at home:  Drink enough fluids to keep your pee (urine) clear or pale yellow.  If you are sent home with a tube that drains the bladder (catheter), there will be a drainage bag attached to it. There are two types of bags. One is big that you can wear at night without having to empty it. One is smaller and needs to be emptied more often.  Keep the drainage bag emptied.  Keep the drainage bag lower than the tube.  Only take medicine as told by your doctor. Contact a doctor if:  You have a low-grade fever.  You have spasms or you are leaking pee when you have spasms. Get help right away if:  You have chills or a fever.  Your catheter stops draining pee.  Your catheter falls out.  You have increased bleeding that does not stop after you have rested and increased the amount of fluids you had been drinking. This information is not intended to replace advice given to you by your health care provider. Make sure you discuss any questions you have with your health care provider. Document Released: 08/02/2007 Document Revised: 07/22/2015 Document Reviewed: 07/25/2012 Elsevier Interactive Patient Education  2017 [employer]    IF you received an x-ray today, you will receive an [facility]. Please contact [facility] at [phone] with questions or concerns regarding your invoice.   IF you received labwork today, you will receive an invoice from [facility]. Please contact [facility] at [phone] with questions or concerns regarding your invoice.   Our billing staff will not be able  to assist you with questions regarding bills from these companies.  You will be contacted with the lab results as soon as they are available. The fastest way to get your results is to activate your My Chart account. Instructions are located on the last page of this paperwork. If you have not heard from us regarding the results in 2 weeks, please contact this office.

## 2016-01-17 LAB — URINE CULTURE

## 2016-01-23 ENCOUNTER — Other Ambulatory Visit: Payer: Self-pay | Admitting: Physician Assistant

## 2016-01-23 DIAGNOSIS — N390 Urinary tract infection, site not specified: Secondary | ICD-10-CM

## 2016-01-23 DIAGNOSIS — R319 Hematuria, unspecified: Principal | ICD-10-CM

## 2016-08-21 ENCOUNTER — Other Ambulatory Visit: Payer: Self-pay | Admitting: Physician Assistant

## 2016-08-21 DIAGNOSIS — N390 Urinary tract infection, site not specified: Secondary | ICD-10-CM

## 2016-08-21 DIAGNOSIS — R319 Hematuria, unspecified: Principal | ICD-10-CM

## 2016-08-22 ENCOUNTER — Encounter: Payer: Self-pay | Admitting: Physician Assistant

## 2016-08-22 ENCOUNTER — Ambulatory Visit (INDEPENDENT_AMBULATORY_CARE_PROVIDER_SITE_OTHER): Payer: BLUE CROSS/BLUE SHIELD | Admitting: Physician Assistant

## 2016-08-22 VITALS — BP 127/78 | HR 80 | Temp 98.8°F | Resp 16 | Ht 61.0 in | Wt 149.2 lb

## 2016-08-22 DIAGNOSIS — R300 Dysuria: Secondary | ICD-10-CM | POA: Diagnosis not present

## 2016-08-22 DIAGNOSIS — R319 Hematuria, unspecified: Secondary | ICD-10-CM | POA: Diagnosis not present

## 2016-08-22 DIAGNOSIS — N390 Urinary tract infection, site not specified: Secondary | ICD-10-CM

## 2016-08-22 DIAGNOSIS — R350 Frequency of micturition: Secondary | ICD-10-CM

## 2016-08-22 LAB — POCT URINALYSIS DIP (MANUAL ENTRY)
Bilirubin, UA: NEGATIVE
Glucose, UA: NEGATIVE mg/dL
Ketones, POC UA: NEGATIVE mg/dL
Nitrite, UA: NEGATIVE
Protein Ur, POC: >=300 mg/dL — AB
Spec Grav, UA: 1.02 (ref 1.010–1.025)
Urobilinogen, UA: 0.2 E.U./dL
pH, UA: 8.5 — AB (ref 5.0–8.0)

## 2016-08-22 LAB — POC MICROSCOPIC URINALYSIS (UMFC): Mucus: ABSENT

## 2016-08-22 MED ORDER — NITROFURANTOIN MONOHYD MACRO 100 MG PO CAPS: 100.0000 mg | ORAL_CAPSULE | Freq: Two times a day (BID) | ORAL | 0 refills | Status: AC

## 2016-08-22 NOTE — Patient Instructions (Addendum)
Please take entire course of your antibiotics, even if you start to feel better.  Thank you for coming in today. I hope you feel we met your needs.  Feel free to call [facility] if you have any questions or further requests.  Please consider signing up for MyChart if you do not already have it, as this is a great way to communicate with me.  Best,  Whitney McVey, PA-C   Urinary Tract Infection, Adult A urinary tract infection (UTI) is an infection of any part of the urinary tract, which includes the kidneys, ureters, bladder, and urethra. These organs make, store, and get rid of urine in the body. UTI can be a bladder infection (cystitis) or kidney infection (pyelonephritis). What are the causes? This infection may be caused by fungi, viruses, or bacteria. Bacteria are the most common cause of UTIs. This condition can also be caused by repeated incomplete emptying of the bladder during urination. What increases the risk? This condition is more likely to develop if:  You ignore your need to urinate or hold urine for long periods of time.  You do not empty your bladder completely during urination.  You wipe back to front after urinating or having a bowel movement, if you are female.  You are uncircumcised, if you are female.  You are constipated.  You have a urinary catheter that stays in place (indwelling).  You have a weak defense (immune) system.  You have a medical condition that affects your bowels, kidneys, or bladder.  You have diabetes.  You take antibiotic medicines frequently or for long periods of time, and the antibiotics no longer work well against certain types of infections (antibiotic resistance).  You take medicines that irritate your urinary tract.  You are exposed to chemicals that irritate your urinary tract.  You are female.  What are the signs or symptoms? Symptoms of this condition include:  Fever.  Frequent urination or passing small amounts of urine  frequently.  Needing to urinate urgently.  Pain or burning with urination.  Urine that smells bad or unusual.  Cloudy urine.  Pain in the lower abdomen or back.  Trouble urinating.  Blood in the urine.  Vomiting or being less hungry than normal.  Diarrhea or abdominal pain.  Vaginal discharge, if you are female.  How is this diagnosed? This condition is diagnosed with a medical history and physical exam. You will also need to provide a urine sample to test your urine. Other tests may be done, including:  Blood tests.  Sexually transmitted disease (STD) testing.  If you have had more than one UTI, a cystoscopy or imaging studies may be done to determine the cause of the infections. How is this treated? Treatment for this condition often includes a combination of two or more of the following:  Antibiotic medicine.  Other medicines to treat less common causes of UTI.  Over-the-counter medicines to treat pain.  Drinking enough water to stay hydrated.  Follow these instructions at home:  Take over-the-counter and prescription medicines only as told by your health care provider.  If you were prescribed an antibiotic, take it as told by your health care provider. Do not stop taking the antibiotic even if you start to feel better.  Avoid alcohol, caffeine, tea, and carbonated beverages. They can irritate your bladder.  Drink enough fluid to keep your urine clear or pale yellow.  Keep all follow-up visits as told by your health care provider. This is important.  Make  sure to: ? Empty your bladder often and completely. Do not hold urine for long periods of time. ? Empty your bladder before and after sex. ? Wipe from front to back after a bowel movement if you are female. Use each tissue one time when you wipe. Contact a health care provider if:  You have back pain.  You have a fever.  You feel nauseous or vomit.  Your symptoms do not get better after 3  days.  Your symptoms go away and then return. Get help right away if:  You have severe back pain or lower abdominal pain.  You are vomiting and cannot keep down any medicines or water. This information is not intended to replace advice given to you by your health care provider. Make sure you discuss any questions you have with your health care provider. Document Released: 11/23/2004 Document Revised: 07/28/2015 Document Reviewed: 01/04/2015 Elsevier Interactive Patient Education  2017 [employer].  IF you received an x-ray today, you will receive an invoice from [facility]. Please contact [facility] at [phone] with questions or concerns regarding your invoice.   IF you received labwork today, you will receive an invoice from [facility]. Please contact [facility] at [phone] with questions or concerns regarding your invoice.   Our billing staff will not be able to assist you with questions regarding bills from these companies.  You will be contacted with the lab results as soon as they are available. The fastest way to get your results is to activate your My Chart account. Instructions are located on the last page of this paperwork. If you have not heard from [place] regarding the results in 2 weeks, please contact this office.

## 2016-08-22 NOTE — Progress Notes (Signed)
Barbara Moody  MRN: [id number] DOB: 03-28-1990  PCP: Patient, No Pcp Per  Subjective:  Pt is a 26 year old female who presents to clinic for increased frequency of urination x 2 days. "I feel like I have to go, but I can't really go". Patient complains of dysuria, frequency, incomplete bladder emptying and urgency. She has had symptoms for 2 day. Patient also complains of none. Patient denies back pain, fever and vaginal discharge. Patient does not have a history of recurrent UTI. Patient does not have a history of pyelonephritis. She has tried cranberry juice. Last UTI 12/2016. She is married. She has sex with her husband only.    Review of Systems  Constitutional: Negative for chills, fatigue and fever.  Gastrointestinal: Negative for abdominal pain, diarrhea, nausea and vomiting.  Genitourinary: Positive for dysuria and frequency. Negative for decreased urine volume, difficulty urinating, enuresis, flank pain, hematuria, urgency, vaginal discharge and vaginal pain.  Musculoskeletal: Negative for back pain.  Neurological: Negative for dizziness, weakness, light-headedness and headaches.    There are no active problems to display for this patient.   Current Outpatient Prescriptions on File Prior to Visit  Medication Sig Dispense Refill  . valACYclovir (VALTREX) 500 MG tablet Take 1 tablet (500 mg total) by mouth daily. Increase to twice a day for 5 days at first sign of an outbreak 90 tablet 4  . sertraline (ZOLOFT) 50 MG tablet Take 1 tablet (50 mg total) by mouth daily. (Patient not taking: Reported on 01/14/2016) 30 tablet 2   No current facility-administered medications on file prior to visit.     No Known Allergies   Objective:  BP 127/78   Pulse 80   Temp 98.8 F (37.1 C) (Oral)   Resp 16   Ht 5\' 1"  (1.549 m)   Wt 149 lb 3.2 oz (67.7 kg)   LMP 08/18/2016   SpO2 99%   BMI 28.19 kg/m   Physical Exam  Constitutional: She is oriented to person, place, and time and  well-developed, well-nourished, and in no distress. No distress.  Cardiovascular: Normal rate, regular rhythm and normal heart sounds.   Abdominal: Soft. There is no tenderness. There is no CVA tenderness.  Neurological: She is alert and oriented to person, place, and time. GCS score is 15.  Skin: Skin is warm and dry.  Psychiatric: Mood, memory, affect and judgment normal.  Vitals reviewed.  Results for orders placed or performed in visit on 08/22/16  POCT Microscopic Urinalysis ([facility])  Result Value Ref Range   WBC,UR,HPF,POC Too numerous to count  (A) None WBC/hpf   RBC,UR,HPF,POC Few (A) None RBC/hpf   Bacteria Many (A) None, Too numerous to count   Mucus Absent Absent   Epithelial Cells, UR Per Microscopy Few (A) None, Too numerous to count cells/hpf  POCT urinalysis dipstick  Result Value Ref Range   Color, UA yellow yellow   Clarity, UA cloudy (A) clear   Glucose, UA negative negative mg/dL   Bilirubin, UA negative negative   Ketones, POC UA negative negative mg/dL   Spec Grav, UA 4.098 1.191 - 1.025   Blood, UA moderate (A) negative   pH, UA 8.5 (A) 5.0 - 8.0   Protein Ur, POC >=300 (A) negative mg/dL   Urobilinogen, UA 0.2 0.2 or 1.0 E.U./dL   Nitrite, UA Negative Negative   Leukocytes, UA Trace (A) Negative    Assessment and Plan :  1. Urinary tract infection with hematuria, site unspecified 2. Increased frequency of  urination 3. Dysuria - nitrofurantoin, macrocrystal-monohydrate, (MACROBID) 100 MG capsule; Take 1 capsule (100 mg total) by mouth 2 (two) times daily.  Dispense: 10 capsule; Refill: 0 - POCT Microscopic Urinalysis ([facility]) - POCT urinalysis dipstick - Urine Culture - Positive labs indicating UTI. Plan to treat. Culture is pending. Will contact if change in therapy is needed. Not concerned for pyelonephritis. encouraged increase intake of water and urinating when needed. RTC if symptoms do not improve/worsen. She agrees with plan.    Marco CollieWhitney Annis Lagoy, PA-C    [facility] 08/22/2016 11:17 AM

## 2016-08-26 LAB — URINE CULTURE

## 2017-01-09 ENCOUNTER — Ambulatory Visit: Payer: BLUE CROSS/BLUE SHIELD | Admitting: Physician Assistant

## 2017-07-18 ENCOUNTER — Other Ambulatory Visit: Payer: Self-pay

## 2017-07-18 ENCOUNTER — Ambulatory Visit (INDEPENDENT_AMBULATORY_CARE_PROVIDER_SITE_OTHER): Payer: BLUE CROSS/BLUE SHIELD | Admitting: Family Medicine

## 2017-07-18 ENCOUNTER — Encounter: Payer: Self-pay | Admitting: Family Medicine

## 2017-07-18 VITALS — BP 112/64 | HR 91 | Temp 98.2°F | Resp 18 | Ht 61.0 in | Wt 145.6 lb

## 2017-07-18 DIAGNOSIS — Z3401 Encounter for supervision of normal first pregnancy, first trimester: Secondary | ICD-10-CM

## 2017-07-18 DIAGNOSIS — N926 Irregular menstruation, unspecified: Secondary | ICD-10-CM | POA: Diagnosis not present

## 2017-07-18 LAB — POCT URINE PREGNANCY: PREG TEST UR: POSITIVE — AB

## 2017-07-18 NOTE — Patient Instructions (Addendum)
Check out [email or web address] with Malachy Mood - this was my favorite part of my first pregnancy because it was a time to celebrate and enjoy your body, bond with other women, and hear about other pregnancy stories.  The old tried and true book is the best - "What to expect when you are expecting" - there are always LOTS of copies at [employer] used book store  You are 5 weeks and 0 days along today based upon the first day of your last menstrual period as 06/13/2017.  Your due date is March 20, 2018.  Check out the different ob websites to decide where you want to establish - consider [facility], [facility], [facility], [facility], [facility] - I have had pts who have all had great experiences with these doctors. Call to schedule your new OB appointment asap but you can make an appointment just to "interview" the practice and see if it might be the right fit for you as well.  HYDRATE. Get plenty of protein and iron in your diet. Use vitamin B6 and ginger for nausea. Take your prenatal multivitamin every day Use a fiber supplement, miralax, or docusate to avoid any constipation. Use TUMS, zantac, or pepcid as needed for heartburn.  IF you received an x-ray today, you will receive an invoice from [facility]. Please contact [facility] at [phone] with questions or concerns regarding your invoice.   IF you received labwork today, you will receive an invoice from [facility]. Please contact [facility] at [phone] with questions or concerns regarding your invoice.   Our billing staff will not be able to assist you with questions regarding bills from these companies.  You will be contacted with the lab results as soon as they are available. The fastest way to get your results is to activate your My Chart account. Instructions are located on the last page of this paperwork. If you have not heard from [place] regarding the results in 2 weeks,  please contact this office.    Commonly Asked Questions During Pregnancy  Cats: A parasite can be excreted in cat feces.  To avoid exposure you need to have another person empty the little box.  If you must empty the litter box you will need to wear gloves.  Wash your hands after handling your cat.  This parasite can also be found in raw or undercooked meat so this should also be avoided.  Colds, Sore Throats, Flu: Please check your medication sheet to see what you can take for symptoms.  If your symptoms are unrelieved by these medications please call the office.  Dental Work: Most any dental work Investment banker, corporate recommends is permitted.  X-rays should only be taken during the first trimester if absolutely necessary.  Your abdomen should be shielded with a lead apron during all x-rays.  Please notify your provider prior to receiving any x-rays.  Novocaine is fine; gas is not recommended.  If your dentist requires a note from [place] prior to dental work please call the office and we will provide one for you.  Exercise: Exercise is an important part of staying healthy during your pregnancy.  You may continue most exercises you were accustomed to prior to pregnancy.  Later in your pregnancy you will most likely notice you have difficulty with activities requiring balance like riding a bicycle.  It is important that you listen to your body and avoid activities that put you at a higher risk of falling.  Adequate rest and staying well hydrated are a must!  If you have questions about the safety of specific activities ask your provider.    Exposure to Children with illness: Try to avoid obvious exposure; report any symptoms to [place] when noted,  If you have chicken pos, red measles or mumps, you should be immune to these diseases.   Please do not take any vaccines while pregnant unless you have checked with your OB provider.  Fetal Movement: After 28 weeks we recommend you do "kick counts" twice daily.  Lie or sit  down in a calm quiet environment and count your baby movements "kicks".  You should feel your baby at least 10 times per hour.  If you have not felt 10 kicks within the first hour get up, walk around and have something sweet to eat or drink then repeat for an additional hour.  If count remains less than 10 per hour notify your provider.  Fumigating: Follow your pest control agent's advice as to how long to stay out of your home.  Ventilate the area well before re-entering.  Hemorrhoids:   Most over-the-counter preparations can be used during pregnancy.  Check your medication to see what is safe to use.  It is important to use a stool softener or fiber in your diet and to drink lots of liquids.  If hemorrhoids seem to be getting worse please call the office.   Hot Tubs:  Hot tubs Jacuzzis and saunas are not recommended while pregnant.  These increase your internal body temperature and should be avoided.  Intercourse:  Sexual intercourse is safe during pregnancy as long as you are comfortable, unless otherwise advised by your provider.  Spotting may occur after intercourse; report any bright red bleeding that is heavier than spotting.  Labor:  If you know that you are in labor, please go to the hospital.  If you are unsure, please call the office and let us help you decide what to do.  Lifting, straining, etc:  If your job requires heavy lifting or straining please check with your provider for any limitations.  Generally, you should not lift items heavier than that you can lift simply with your hands and arms (no back muscles)  Painting:  Paint fumes do not harm your pregnancy, but may make you ill and should be avoided if possible.  Latex or water based paints have less odor than oils.  Use adequate ventilation while painting.  Permanents & Hair Color:  Chemicals in hair dyes are not recommended as they cause increase hair dryness which can increase hair loss during pregnancy.  " Highlighting" and  permanents are allowed.  Dye may be absorbed differently and permanents may not hold as well during pregnancy.  Sunbathing:  Use a sunscreen, as skin burns easily during pregnancy.  Drink plenty of fluids; avoid over heating.  Tanning Beds:  Because their possible side effects are still unknown, tanning beds are not recommended.  Ultrasound Scans:  Routine ultrasounds are performed at approximately 20 weeks.  You will be able to see your baby's general anatomy an if you would like to know the gender this can usually be determined as well.  If it is questionable when you conceived you may also receive an ultrasound early in your pregnancy for dating purposes.  Otherwise ultrasound exams are not routinely performed unless there is a medical necessity.  Although you can request a scan we ask that you pay for it when conducted because insurance does  not cover " patient request" scans.  Work: If your pregnancy proceeds without complications you may work until your due date, unless your physician or employer advises otherwise.  Round Ligament Pain/Pelvic Discomfort:  Sharp, shooting pains not associated with bleeding are fairly common, usually occurring in the second trimester of pregnancy.  They tend to be worse when standing up or when you remain standing for long periods of time.  These are the result of pressure of certain pelvic ligaments called "round ligaments".  Rest, Tylenol and heat seem to be the most effective relief.  As the womb and fetus grow, they rise out of the pelvis and the discomfort improves.  Please notify the office if your pain seems different than that described.  It may represent a more serious condition.  Common Medications Safe in Pregnancy  Acne:      Constipation:  Benzoyl Peroxide     Colace  Clindamycin      Dulcolax Suppository  Topica Erythromycin     Fibercon  Salicylic Acid      Metamucil         Miralax AVOID:         Senakot   Accutane    Cough:  Retin-A       Cough Drops  Tetracycline      Phenergan w/ Codeine if Rx  Minocycline      Robitussin (Plain & DM)  Antibiotics:     Crabs/Lice:  Ceclor       RID  Cephalosporins    AVOID:  E-Mycins      Kwell  Keflex  Macrobid/Macrodantin   Diarrhea:  Penicillin      Kao-Pectate  Zithromax      Imodium AD         PUSH FLUIDS AVOID:       Cipro     Fever:  Tetracycline      Tylenol (Regular or Extra  Minocycline       Strength)  Levaquin      Extra Strength-Do not          Exceed 8 tabs/24 hrs Caffeine:        <260m/day (equiv. To 1 cup of coffee or  approx. 3 12 oz sodas)         Gas: Cold/Hayfever:       Gas-X  Benadryl      Mylicon  Claritin       Phazyme  **Claritin-D        Chlor-Trimeton    Headaches:  Dimetapp      ASA-Free Excedrin  Drixoral-Non-Drowsy     Cold Compress  Mucinex (Guaifenasin)     Tylenol (Regular or Extra  Sudafed/Sudafed-12 Hour     Strength)  **Sudafed PE Pseudoephedrine   Tylenol Cold & Sinus     Vicks Vapor Rub  Zyrtec  **AVOID if Problems With Blood Pressure         Heartburn: Avoid lying down for at least 1 hour after meals  Aciphex      Maalox     Rash:  Milk of Magnesia     Benadryl    Mylanta       1% Hydrocortisone Cream  Pepcid  Pepcid Complete   Sleep Aids:  Prevacid      Ambien   Prilosec       Benadryl  Rolaids       Chamomile Tea  Tums (Limit 4/day)     Unisom  Zantac  Tylenol PM         Warm milk-add vanilla or  Hemorrhoids:       Sugar for taste  Anusol/Anusol H.C.  (RX: Analapram 2.5%)  Sugar Substitutes:  Hydrocortisone OTC     Ok in moderation  Preparation H      Tucks        Vaseline lotion applied to tissue with wiping    Herpes:     Throat:  Acyclovir      Oragel  Famvir  Valtrex     Vaccines:         Flu Shot Leg Cramps:       *Gardasil  Benadryl      Hepatitis A         Hepatitis B Nasal Spray:       Pneumovax  Saline Nasal Spray     Polio  Booster         Tetanus Nausea:       Tuberculosis test or PPD  Vitamin B6 25 mg TID   AVOID:    Dramamine      *Gardasil  Emetrol       Live Poliovirus  Ginger Root 250 mg QID    MMR (measles, mumps &  High Complex Carbs @ Bedtime    rebella)  Sea Bands-Accupressure    Varicella (Chickenpox)  Unisom 1/2 tab TID     *No known complications           If received before Pain:         Known pregnancy;   Darvocet       Resume series after  Lortab        Delivery  Percocet    Yeast:   Tramadol      Femstat  Tylenol 3      Gyne-lotrimin  Ultram       Monistat  Vicodin           MISC:         All Sunscreens           Hair Coloring/highlights          Insect Repellant's          (Including DEET)         Mystic Tans  Eating Plan for Pregnant Women While you are pregnant, your body will require additional nutrition to help support your growing baby. It is recommended that you consume:  150 additional calories each day during your first trimester.  300 additional calories each day during your second trimester.  300 additional calories each day during your third trimester.  Eating a healthy, well-balanced diet is very important for your health and for your baby's health. You also have a higher need for some vitamins and minerals, such as folic acid, calcium, iron, and vitamin D. What do I need to know about eating during pregnancy?  Do not try to lose weight or go on a diet during pregnancy.  Choose healthy, nutritious foods. Choose  of a sandwich with a glass of milk instead of a candy bar or a high-calorie sugar-sweetened beverage.  Limit your overall intake of foods that have "empty calories." These are foods that have little nutritional value, such as sweets, desserts, candies, sugar-sweetened beverages, and fried foods.  Eat a variety of foods, especially fruits and vegetables.  Take a prenatal vitamin to help meet the additional needs during pregnancy, specifically for folic  acid, iron, calcium, and vitamin D.  Remember to stay  active. Ask your health care provider for exercise recommendations that are specific to you.  Practice good food safety and cleanliness, such as washing your hands before you eat and after you prepare raw meat. This helps to prevent foodborne illnesses, such as listeriosis, that can be very dangerous for your baby. Ask your health care provider for more information about listeriosis. What does 150 extra calories look like? Healthy options for an additional 150 calories each day could be any of the following:  Plain low-fat yogurt (6-8 oz) with  cup of berries.  1 apple with 2 teaspoons of peanut butter.  Cut-up vegetables with  cup of hummus.  Low-fat chocolate milk (8 oz or 1 cup).  1 string cheese with 1 medium orange.   of a peanut butter and jelly sandwich on whole-wheat bread (1 tsp of peanut butter).  For 300 calories, you could eat two of those healthy options each day. What is a healthy amount of weight to gain? The recommended amount of weight for you to gain is based on your pre-pregnancy BMI. If your pre-pregnancy BMI was:  Less than 18 (underweight), you should gain 28-40 lb.  18-24.9 (normal), you should gain 25-35 lb.  25-29.9 (overweight), you should gain 15-25 lb.  Greater than 30 (obese), you should gain 11-20 lb.  What if I am having twins or multiples? Generally, pregnant women who will be having twins or multiples may need to increase their daily calories by 300-600 calories each day. The recommended range for total weight gain is 25-54 lb, depending on your pre-pregnancy BMI. Talk with your health care provider for specific guidance about additional nutritional needs, weight gain, and exercise during your pregnancy. What foods can I eat? Grains Any grains. Try to choose whole grains, such as whole-wheat bread, oatmeal, or brown rice. Vegetables Any vegetables. Try to eat a variety of colors and types  of vegetables to get a full range of vitamins and minerals. Remember to wash your vegetables well before eating. Fruits Any fruits. Try to eat a variety of colors and types of fruit to get a full range of vitamins and minerals. Remember to wash your fruits well before eating. Meats and Other Protein Sources Lean meats, including chicken, [place], fish, and lean cuts of beef, veal, or pork. Make sure that all meats are cooked to "well done." Tofu. Tempeh. Beans. Eggs. Peanut butter and other nut butters. Seafood, such as shrimp, crab, and lobster. If you choose fish, select types that are higher in omega-3 fatty acids, including salmon, herring, mussels, trout, sardines, and pollock. Make sure that all meats are cooked to food-safe temperatures. Dairy Pasteurized milk and milk alternatives. Pasteurized yogurt and pasteurized cheese. Cottage cheese. Sour cream. Beverages Water. Juices that contain 100% fruit juice or vegetable juice. Caffeine-free teas and decaffeinated coffee. Drinks that contain caffeine are okay to drink, but it is better to avoid caffeine. Keep your total caffeine intake to less than 200 mg each day (12 oz of coffee, tea, or soda) or as directed by your health care provider. Condiments Any pasteurized condiments. Sweets and Desserts Any sweets and desserts. Fats and Oils Any fats and oils. The items listed above may not be a complete list of recommended foods or beverages. Contact your dietitian for more options. What foods are not recommended? Vegetables Unpasteurized (raw) vegetable juices. Fruits Unpasteurized (raw) fruit juices. Meats and Other Protein Sources Cured meats that have nitrates, such as bacon, salami, and hotdogs. Luncheon meats, bologna, or other  deli meats (unless they are reheated until they are steaming hot). Refrigerated pate, meat spreads from a meat counter, smoked seafood that is found in the refrigerated section of a store. Raw fish, such as sushi or  sashimi. High mercury content fish, such as tilefish, shark, swordfish, and king mackerel. Raw meats, such as tuna or beef tartare. Undercooked meats and poultry. Make sure that all meats are cooked to food-safe temperatures. Dairy Unpasteurized (raw) milk and any foods that have raw milk in them. Soft cheeses, such as feta, queso blanco, queso fresco, Brie, Camembert cheeses, blue-veined cheeses, and Panela cheese (unless it is made with pasteurized milk, which must be stated on the label). Beverages Alcohol. Sugar-sweetened beverages, such as sodas, teas, or energy drinks. Condiments Homemade fermented foods and drinks, such as pickles, sauerkraut, or kombucha drinks. (Store-bought pasteurized versions of these are okay.) Other Salads that are made in the store, such as ham salad, chicken salad, egg salad, tuna salad, and seafood salad. The items listed above may not be a complete list of foods and beverages to avoid. Contact your dietitian for more information. This information is not intended to replace advice given to you by your health care provider. Make sure you discuss any questions you have with your health care provider. Document Released: 11/28/2013 Document Revised: 07/22/2015 Document Reviewed: 07/29/2013 Elsevier Interactive Patient Education  2018 [employer].  How a Baby Grows During Pregnancy Pregnancy begins when a female's sperm enters a female's egg (fertilization). This happens in one of the tubes (fallopian tubes) that connect the ovaries to the womb (uterus). The fertilized egg is called an embryo until it reaches 10 weeks. From 10 weeks until birth, it is called a fetus. The fertilized egg moves down the fallopian tube to the uterus. Then it implants into the lining of the uterus and begins to grow. The developing fetus receives oxygen and nutrients through the pregnant woman's bloodstream and the tissues that grow (placenta) to support the fetus. The placenta is the life  support system for the fetus. It provides nutrition and removes waste. Learning as much as you can about your pregnancy and how your baby is developing can help you enjoy the experience. It can also make you aware of when there might be a problem and when to ask questions. How long does a typical pregnancy last? A pregnancy usually lasts 280 days, or about 40 weeks. Pregnancy is divided into three trimesters:  First trimester: 0-13 weeks.  Second trimester: 14-27 weeks.  Third trimester: 28-40 weeks.  The day when your baby is considered ready to be born (full term) is your estimated date of delivery. How does my baby develop month by month? First month  The fertilized egg attaches to the inside of the uterus.  Some cells will form the placenta. Others will form the fetus.  The arms, legs, brain, spinal cord, lungs, and heart begin to develop.  At the end of the first month, the heart begins to beat.  Second month  The bones, inner ear, eyelids, hands, and feet form.  The genitals develop.  By the end of 8 weeks, all major organs are developing.  Third month  All of the internal organs are forming.  Teeth develop below the gums.  Bones and muscles begin to grow. The spine can flex.  The skin is transparent.  Fingernails and toenails begin to form.  Arms and legs continue to grow longer, and hands and feet develop.  The fetus is about  3 in (7.6 cm) long.  Fourth month  The placenta is completely formed.  The external sex organs, neck, outer ear, eyebrows, eyelids, and fingernails are formed.  The fetus can hear, swallow, and move its arms and legs.  The kidneys begin to produce urine.  The skin is covered with a white waxy coating (vernix) and very fine hair (lanugo).  Fifth month  The fetus moves around more and can be felt for the first time (quickening).  The fetus starts to sleep and wake up and may begin to suck its finger.  The nails grow to the  end of the fingers.  The organ in the digestive system that makes bile (gallbladder) functions and helps to digest the nutrients.  If your baby is a girl, eggs are present in her ovaries. If your baby is a boy, testicles start to move down into his scrotum.  Sixth month  The lungs are formed, but the fetus is not yet able to breathe.  The eyes open. The brain continues to develop.  Your baby has fingerprints and toe prints. Your baby's hair grows thicker.  At the end of the second trimester, the fetus is about 9 in (22.9 cm) long.  Seventh month  The fetus kicks and stretches.  The eyes are developed enough to sense changes in light.  The hands can make a grasping motion.  The fetus responds to sound.  Eighth month  All organs and body systems are fully developed and functioning.  Bones harden and taste buds develop. The fetus may hiccup.  Certain areas of the brain are still developing. The skull remains soft.  Ninth month  The fetus gains about  lb (0.23 kg) each week.  The lungs are fully developed.  Patterns of sleep develop.  The fetus's head typically moves into a head-down position (vertex) in the uterus to prepare for birth. If the buttocks move into a vertex position instead, the baby is breech.  The fetus weighs 6-9 lbs (2.72-4.08 kg) and is 19-20 in (48.26-50.8 cm) long.  What can I do to have a healthy pregnancy and help my baby develop? Eating and Drinking  Eat a healthy diet. ? Talk with your health care provider to make sure that you are getting the nutrients that you and your baby need. ? Visit www.BuildDNA.es to learn about creating a healthy diet.  Gain a healthy amount of weight during pregnancy as advised by your health care provider. This is usually 25-35 pounds. You may need to: ? Gain more if you were underweight before getting pregnant or if you are pregnant with more than one baby. ? Gain less if you were overweight or obese when  you got pregnant.  Medicines and Vitamins  Take prenatal vitamins as directed by your health care provider. These include vitamins such as folic acid, iron, calcium, and vitamin D. They are important for healthy development.  Take medicines only as directed by your health care provider. Read labels and ask a pharmacist or your health care provider whether over-the-counter medicines, supplements, and prescription drugs are safe to take during pregnancy.  Activities  Be physically active as advised by your health care provider. Ask your health care provider to recommend activities that are safe for you to do, such as walking or swimming.  Do not participate in strenuous or extreme sports.  Lifestyle  Do not drink alcohol.  Do not use any tobacco products, including cigarettes, chewing tobacco, or electronic cigarettes. If you need  help quitting, ask your health care provider.  Do not use illegal drugs.  Safety  Avoid exposure to mercury, lead, or other heavy metals. Ask your health care provider about common sources of these heavy metals.  Avoid listeria infection during pregnancy. Follow these precautions: ? Do not eat soft cheeses or deli meats. ? Do not eat hot dogs unless they have been warmed up to the point of steaming, such as in the microwave oven. ? Do not drink unpasteurized milk.  Avoid toxoplasmosis infection during pregnancy. Follow these precautions: ? Do not change your cat's litter box, if you have a cat. Ask someone else to do this for you. ? Wear gardening gloves while working in the yard.  General Instructions  Keep all follow-up visits as directed by your health care provider. This is important. This includes prenatal care and screening tests.  Manage any chronic health conditions. Work closely with your health care provider to keep conditions, such as diabetes, under control.  How do I know if my baby is developing well? At each prenatal visit, your health  care provider will do several different tests to check on your health and keep track of your baby's development. These include:  Fundal height. ? Your health care provider will measure your growing belly from top to bottom using a tape measure. ? Your health care provider will also feel your belly to determine your baby's position.  Heartbeat. ? An ultrasound in the first trimester can confirm pregnancy and show a heartbeat, depending on how far along you are. ? Your health care provider will check your baby's heart rate at every prenatal visit. ? As you get closer to your delivery date, you may have regular fetal heart rate monitoring to make sure that your baby is not in distress.  Second trimester ultrasound. ? This ultrasound checks your baby's development. It also indicates your baby's gender.  What should I do if I have concerns about my baby's development? Always talk with your health care provider about any concerns that you may have. This information is not intended to replace advice given to you by your health care provider. Make sure you discuss any questions you have with your health care provider. Document Released: 08/02/2007 Document Revised: 07/22/2015 Document Reviewed: 07/23/2013 Elsevier Interactive Patient Education  2018 [employer]. Exercise During Pregnancy For people of all ages, exercise is an important part of being healthy. Exercise improves heart and lung function and helps to maintain strength, flexibility, and a healthy body weight. Exercise also boosts energy levels and elevates mood. For most women, maintaining an exercise routine throughout pregnancy is recommended. It is only on rare occasions and with certain medical conditions or pregnancy complications that women may be asked to limit or avoid exercise during pregnancy. What are some other benefits to exercising during pregnancy? Along with maintaining strength and flexibility, exercising throughout  pregnancy can help to:  Keep strength in muscles that are very important during labor and childbirth.  Decrease low back pain during pregnancy.  Decrease the risk of developing gestational diabetes mellitus (GDM).  Improve blood sugar (glucose) control for women who have GDM.  Decrease the risk of developing preeclampsia. This is a serious condition that causes high blood pressure along with other symptoms, such as swelling and headaches.  Decrease the risk of cesarean delivery.  Speed up the recovery after giving birth.  How often should I exercise? Unless your health care provider gives you different instructions, you should try to  exercise on most days or all days of the week. In general, try to exercise with moderate intensity for about 150 minutes per week. This can be spread out across several days, such as exercising for 30 minutes per day on 5 days of each week. You can tell that you are exercising at a moderate intensity if you have a higher heart rate and faster breathing, but you are still able to hold a conversation. What types of moderate-intensity exercise are recommended during pregnancy? There are many types of exercise that are safe for you to do during pregnancy. Unless your health care provider gives you different instructions, do a variety of exercises that safely increase your heart and breathing (cardiopulmonary) rates and help you to build and maintain muscle strength (strength training). You should always be able to talk in full sentences while exercising during pregnancy. Some examples of exercising that is safe to do during pregnancy include:  Brisk walking or hiking.  Swimming.  Water aerobics.  Riding a stationary bike.  Strength training.  Modified yoga or Pilates. Tell your instructor that you are pregnant. Avoid overstretching and avoid lying on your back for long periods of time.  Running or jogging. Only choose this type of exercise if: ? You ran or  jogged regularly before your pregnancy. ? You can run or jog and still talk in complete sentences.  What types of exercise should I not do during pregnancy? Depending on your level of fitness and whether you exercised regularly before your pregnancy, you may be advised to limit vigorous-intensity exercise during your pregnancy. You can tell that you are exercising at a vigorous intensity if you are breathing much harder and faster and cannot hold a conversation while exercising. Some examples of exercising that you should avoid during pregnancy include:  Contact sports.  Activities that place you at risk for falling on or being hit in the belly, such as downhill skiing, water skiing, surfing, rock climbing, cycling, gymnastics, and horseback riding.  Scuba diving.  Sky diving.  Yoga or Pilates in a room that is heated to extreme temperatures ("hot yoga" or "hot Pilates").  Jogging or running, unless you ran or jogged regularly before your pregnancy. While jogging or running, you should always be able to talk in full sentences. Do not run or jog so vigorously that you are unable to have a conversation.  If you are not used to exercising at elevation (more than 6,000 feet above sea level), do not do so during your pregnancy.  When should I avoid exercising during pregnancy? Certain medical conditions can make it unsafe to exercise during pregnancy, or they may increase your risk of miscarriage or early labor and birth. Some of these conditions include:  Some types of heart disease.  Some types of lung disease.  Placenta previa. This is when the placenta partially or completely covers the opening of the uterus (cervix).  Frequent bleeding from the vagina during your pregnancy.  Incompetent cervix. This is when your cervix does not remain as tightly closed during pregnancy as it should.  Premature labor.  Ruptured membranes. This is when the protective sac (amniotic sac) opens up and  amniotic fluid leaks from your vagina.  Severely low blood count (anemia).  Preeclampsia or pregnancy-caused high blood pressure.  Carrying more than one baby (multiple gestation) and having an additional risk of early labor.  Poorly controlled diabetes.  Being severely underweight or severely overweight.  Intrauterine growth restriction. This is when your baby's  growth and development during pregnancy are slower than expected.  Other medical conditions. Ask your health care provider if any apply to you.  What else should I know about exercising during pregnancy? You should take these precautions while exercising during pregnancy:  Avoid overheating. ? Wear loose-fitting, breathable clothes. ? Do not exercise in very high temperatures.  Avoid dehydration. Drink enough water before, during, and after exercise to keep your urine clear or pale yellow.  Avoid overstretching. Because of hormone changes during pregnancy, it is easy to overstretch muscles, tendons, and ligaments during pregnancy.  Start slowly and ask your health care provider to recommend types of exercise that are safe for you, if exercising regularly is new for you.  Pregnancy is not a time for exercising to lose weight. When should I seek medical care? You should stop exercising and call your health care provider if you have any unusual symptoms, such as:  Mild uterine contractions or abdominal cramping.  Dizziness that does not improve with rest.  When should I seek immediate medical care? You should stop exercising and call your local emergency services (911 in the [place] if you have any unusual symptoms, such as:  Sudden, severe pain in your low back or your belly.  Uterine contractions or abdominal cramping that do not improve with rest.  Chest pain.  Bleeding or fluid leaking from your vagina.  Shortness of breath.  This information is not intended to replace advice given to you by your health care  provider. Make sure you discuss any questions you have with your health care provider. Document Released: 02/13/2005 Document Revised: 07/14/2015 Document Reviewed: 04/23/2014 Elsevier Interactive Patient Education  2018 [place] of Pregnancy The first trimester of pregnancy is from week 1 until the end of week 13 (months 1 through 3). A week after a sperm fertilizes an egg, the egg will implant on the wall of the uterus. This embryo will begin to develop into a baby. Genes from you and your partner will form the baby. The female genes will determine whether the baby will be a boy or a girl. At 6-8 weeks, the eyes and face will be formed, and the heartbeat can be seen on ultrasound. At the end of 12 weeks, all the baby's organs will be formed. Now that you are pregnant, you will want to do everything you can to have a healthy baby. Two of the most important things are to get good prenatal care and to follow your health care provider's instructions. Prenatal care is all the medical care you receive before the baby's birth. This care will help prevent, find, and treat any problems during the pregnancy and childbirth. Body changes during your first trimester Your body goes through many changes during pregnancy. The changes vary from woman to woman.  You may gain or lose a couple of pounds at first.  You may feel sick to your stomach (nauseous) and you may throw up (vomit). If the vomiting is uncontrollable, call your health care provider.  You may tire easily.  You may develop headaches that can be relieved by medicines. All medicines should be approved by your health care provider.  You may urinate more often. Painful urination may mean you have a bladder infection.  You may develop heartburn as a result of your pregnancy.  You may develop constipation because certain hormones are causing the muscles that push stool through your intestines to slow down.  You may develop  hemorrhoids or  swollen veins (varicose veins).  Your breasts may begin to grow larger and become tender. Your nipples may stick out more, and the tissue that surrounds them (areola) may become darker.  Your gums may bleed and may be sensitive to brushing and flossing.  Dark spots or blotches (chloasma, mask of pregnancy) may develop on your face. This will likely fade after the baby is born.  Your menstrual periods will stop.  You may have a loss of appetite.  You may develop cravings for certain kinds of food.  You may have changes in your emotions from day to day, such as being excited to be pregnant or being concerned that something may go wrong with the pregnancy and baby.  You may have more vivid and strange dreams.  You may have changes in your hair. These can include thickening of your hair, rapid growth, and changes in texture. Some women also have hair loss during or after pregnancy, or hair that feels dry or thin. Your hair will most likely return to normal after your baby is born.  What to expect at prenatal visits During a routine prenatal visit:  You will be weighed to make sure you and the baby are growing normally.  Your blood pressure will be taken.  Your abdomen will be measured to track your baby's growth.  The fetal heartbeat will be listened to between weeks 10 and 14 of your pregnancy.  Test results from any previous visits will be discussed.  Your health care provider may ask you:  How you are feeling.  If you are feeling the baby move.  If you have had any abnormal symptoms, such as leaking fluid, bleeding, severe headaches, or abdominal cramping.  If you are using any tobacco products, including cigarettes, chewing tobacco, and electronic cigarettes.  If you have any questions.  Other tests that may be performed during your first trimester include:  Blood tests to find your blood type and to check for the presence of any previous infections. The  tests will also be used to check for low iron levels (anemia) and protein on red blood cells (Rh antibodies). Depending on your risk factors, or if you previously had diabetes during pregnancy, you may have tests to check for high blood sugar that affects pregnant women (gestational diabetes).  Urine tests to check for infections, diabetes, or protein in the urine.  An ultrasound to confirm the proper growth and development of the baby.  Fetal screens for spinal cord problems (spina bifida) and Down syndrome.  HIV (human immunodeficiency virus) testing. Routine prenatal testing includes screening for HIV, unless you choose not to have this test.  You may need other tests to make sure you and the baby are doing well.  Follow these instructions at home: Medicines  Follow your health care provider's instructions regarding medicine use. Specific medicines may be either safe or unsafe to take during pregnancy.  Take a prenatal vitamin that contains at least 600 micrograms (mcg) of folic acid.  If you develop constipation, try taking a stool softener if your health care provider approves. Eating and drinking  Eat a balanced diet that includes fresh fruits and vegetables, whole grains, good sources of protein such as meat, eggs, or tofu, and low-fat dairy. Your health care provider will help you determine the amount of weight gain that is right for you.  Avoid raw meat and uncooked cheese. These carry germs that can cause birth defects in the baby.  Eating four or five  small meals rather than three large meals a day may help relieve nausea and vomiting. If you start to feel nauseous, eating a few soda crackers can be helpful. Drinking liquids between meals, instead of during meals, also seems to help ease nausea and vomiting.  Limit foods that are high in fat and processed sugars, such as fried and sweet foods.  To prevent constipation: ? Eat foods that are high in fiber, such as fresh fruits  and vegetables, whole grains, and beans. ? Drink enough fluid to keep your urine clear or pale yellow. Activity  Exercise only as directed by your health care provider. Most women can continue their usual exercise routine during pregnancy. Try to exercise for 30 minutes at least 5 days a week. Exercising will help you: ? Control your weight. ? Stay in shape. ? Be prepared for labor and delivery.  Experiencing pain or cramping in the lower abdomen or lower back is a good sign that you should stop exercising. Check with your health care provider before continuing with normal exercises.  Try to avoid standing for long periods of time. Move your legs often if you must stand in one place for a long time.  Avoid heavy lifting.  Wear low-heeled shoes and practice good posture.  You may continue to have sex unless your health care provider tells you not to. Relieving pain and discomfort  Wear a good support bra to relieve breast tenderness.  Take warm sitz baths to soothe any pain or discomfort caused by hemorrhoids. Use hemorrhoid cream if your health care provider approves.  Rest with your legs elevated if you have leg cramps or low back pain.  If you develop varicose veins in your legs, wear support hose. Elevate your feet for 15 minutes, 3-4 times a day. Limit salt in your diet. Prenatal care  Schedule your prenatal visits by the twelfth week of pregnancy. They are usually scheduled monthly at first, then more often in the last 2 months before delivery.  Write down your questions. Take them to your prenatal visits.  Keep all your prenatal visits as told by your health care provider. This is important. Safety  Wear your seat belt at all times when driving.  Make a list of emergency phone numbers, including numbers for family, friends, the hospital, and police and fire departments. General instructions  Ask your health care provider for a referral to a local prenatal education  class. Begin classes no later than the beginning of month 6 of your pregnancy.  Ask for help if you have counseling or nutritional needs during pregnancy. Your health care provider can offer advice or refer you to specialists for help with various needs.  Do not use hot tubs, steam rooms, or saunas.  Do not douche or use tampons or scented sanitary pads.  Do not cross your legs for long periods of time.  Avoid cat litter boxes and soil used by cats. These carry germs that can cause birth defects in the baby and possibly loss of the fetus by miscarriage or stillbirth.  Avoid all smoking, herbs, alcohol, and medicines not prescribed by your health care provider. Chemicals in these products affect the formation and growth of the baby.  Do not use any products that contain nicotine or tobacco, such as cigarettes and e-cigarettes. If you need help quitting, ask your health care provider. You may receive counseling support and other resources to help you quit.  Schedule a dentist appointment. At home, brush your teeth  with a soft toothbrush and be gentle when you floss. Contact a health care provider if:  You have dizziness.  You have mild pelvic cramps, pelvic pressure, or nagging pain in the abdominal area.  You have persistent nausea, vomiting, or diarrhea.  You have a bad smelling vaginal discharge.  You have pain when you urinate.  You notice increased swelling in your face, hands, legs, or ankles.  You are exposed to fifth disease or chickenpox.  You are exposed to [place] measles (rubella) and have never had it. Get help right away if:  You have a fever.  You are leaking fluid from your vagina.  You have spotting or bleeding from your vagina.  You have severe abdominal cramping or pain.  You have rapid weight gain or loss.  You vomit blood or material that looks like coffee grounds.  You develop a severe headache.  You have shortness of breath.  You have any kind  of trauma, such as from a fall or a car accident. Summary  The first trimester of pregnancy is from week 1 until the end of week 13 (months 1 through 3).  Your body goes through many changes during pregnancy. The changes vary from woman to woman.  You will have routine prenatal visits. During those visits, your health care provider will examine you, discuss any test results you may have, and talk with you about how you are feeling. This information is not intended to replace advice given to you by your health care provider. Make sure you discuss any questions you have with your health care provider. Document Released: 02/07/2001 Document Revised: 01/26/2016 Document Reviewed: 01/26/2016 Elsevier Interactive Patient Education  [employer].

## 2017-07-18 NOTE — Progress Notes (Signed)
Subjective:  By signing my name below, I, Stann Ore, attest that this documentation has been prepared under the direction and in the presence of Norberto Sorenson, MD. Electronically Signed: Stann Ore, Scribe. 07/18/2017 , 10:53 AM .  Patient was seen in Room 2 .   Patient ID: Barbara Moody, female    DOB: 1990-12-17, 27 y.o.   MRN: [id number] Chief Complaint  Patient presents with  . MIssed Period    pt states she her last period was 06/13/2017. Pt states she took a home pregnancy test  on Sunday and it was positive. Pt states she ius having cramping but not bleeding.   HPI Barbara Moody is a 27 y.o. female who presents to Primary Care at [facility] requesting pregnancy test. She had a home pregnancy test 3 days ago and it was positive. Her last period was 06/13/17, and is about a week late. She started having LLQ abdominal cramping with heartburn Monday (2 days ago). This is her first pregnancy. She started taking prenatal vitamins yesterday. Her bowel movements has slowed down, compared to before. She's been trying to drink more water.   She last took sertraline in 2016 for about a month; after first prescription, she stopped taking it. She has some Valtrex at home. She doesn't have an OBGYN. She usually works with standing on her feet for a long time, working 3rd shift. She has plenty of exercise while at work.   History reviewed. No pertinent past medical history. History reviewed. No pertinent surgical history. Prior to Admission medications   Medication Sig Start Date End Date Taking? Authorizing Provider  sertraline (ZOLOFT) 50 MG tablet Take 1 tablet (50 mg total) by mouth daily. Patient not taking: Reported on 01/14/2016 01/04/15   Peyton Najjar, MD  valACYclovir (VALTREX) 500 MG tablet Take 1 tablet (500 mg total) by mouth daily. Increase to twice a day for 5 days at first sign of an outbreak Patient not taking: Reported on 07/18/2017 09/20/15   Sherren Mocha, MD   No Known  Allergies History reviewed. No pertinent family history. Social History   Socioeconomic History  . Marital status: Single    Spouse name: Not on file  . Number of children: Not on file  . Years of education: Not on file  . Highest education level: Not on file  Occupational History  . Not on file  Social Needs  . Financial resource strain: Not on file  . Food insecurity:    Worry: Not on file    Inability: Not on file  . Transportation needs:    Medical: Not on file    Non-medical: Not on file  Tobacco Use  . Smoking status: Never Smoker  . Smokeless tobacco: Never Used  Substance and Sexual Activity  . Alcohol use: No    Alcohol/week: 0.0 oz  . Drug use: No  . Sexual activity: Not on file  Lifestyle  . Physical activity:    Days per week: Not on file    Minutes per session: Not on file  . Stress: Not on file  Relationships  . Social connections:    Talks on phone: Not on file    Gets together: Not on file    Attends religious service: Not on file    Active member of club or organization: Not on file    Attends meetings of clubs or organizations: Not on file    Relationship status: Not on file  Other Topics Concern  . Not  on file  Social History Narrative  . Not on file   Depression screen [facility] 2/9 07/18/2017 08/22/2016 09/20/2015 01/27/2015 01/04/2015  Decreased Interest 0 0 0 0 0  Down, Depressed, Hopeless 0 0 0 0 0  PHQ - 2 Score 0 0 0 0 0    Review of Systems  Constitutional: Negative for chills, fatigue, fever and unexpected weight change.  Respiratory: Negative for cough.   Gastrointestinal: Negative for constipation, diarrhea, nausea and vomiting.  Skin: Negative for rash and wound.  Neurological: Negative for dizziness, weakness and headaches.       Objective:   Physical Exam  Constitutional: She is oriented to person, place, and time. She appears well-developed and well-nourished. No distress.  HENT:  Head: Normocephalic and atraumatic.  Eyes:  Pupils are equal, round, and reactive to light. EOM are normal.  Neck: Neck supple.  Cardiovascular: Normal rate.  Pulmonary/Chest: Effort normal. No respiratory distress.  Musculoskeletal: Normal range of motion.  Neurological: She is alert and oriented to person, place, and time.  Skin: Skin is warm and dry.  Psychiatric: She has a normal mood and affect. Her behavior is normal.  Nursing note and vitals reviewed.   BP 112/64 (BP Location: Left Arm, Patient Position: Sitting, Cuff Size: Normal)   Pulse 91   Temp 98.2 F (36.8 C) (Oral)   Resp 18   Ht  (1.549 m)   Wt 145 lb 9.6 oz (66 kg)   LMP 06/13/2017   SpO2 100%   BMI 27.51 kg/m   Results for orders placed or performed in visit on 07/18/17  POCT urine pregnancy  Result Value Ref Range   Preg Test, Ur Positive (A) Negative       Assessment & Plan:   1. Missed period   2. Encounter for supervision of normal first pregnancy in first trimester   Start otv pnv mvi.  5 wks and 0d today by first day of LMP 06/13/17 -> EDD 03/20/2017   Orders Placed This Encounter  Procedures  . POCT urine pregnancy    Over 40 min spent in face-to-face evaluation of and consultation with patient and coordination of care.  Over 50% of this time was spent counseling this patient regarding reviewed pregnancy self-care, what to expect in first trimester, establishing with ob, etc.  I personally performed the services described in this documentation, which was scribed in my presence. The recorded information has been reviewed and considered, and addended by me as needed.   Norberto Sorenson, M.D.  Primary Care at [facility] [address] [place], [place] [address] [phone] phone [phone] fax  07/26/17 1:40 PM

## 2017-07-24 ENCOUNTER — Telehealth: Payer: Self-pay | Admitting: Family Medicine

## 2017-07-24 NOTE — Telephone Encounter (Signed)
MyChart message sent to pt about Disability forms

## 2017-07-26 ENCOUNTER — Other Ambulatory Visit: Payer: Self-pay

## 2017-07-26 ENCOUNTER — Encounter: Payer: Self-pay | Admitting: Family Medicine

## 2017-07-26 ENCOUNTER — Ambulatory Visit: Payer: BLUE CROSS/BLUE SHIELD | Admitting: Family Medicine

## 2017-07-26 VITALS — BP 110/60 | HR 85 | Temp 98.3°F | Resp 18 | Ht 61.0 in | Wt 148.0 lb

## 2017-07-26 DIAGNOSIS — R1114 Bilious vomiting: Secondary | ICD-10-CM

## 2017-07-26 DIAGNOSIS — A6004 Herpesviral vulvovaginitis: Secondary | ICD-10-CM

## 2017-07-26 DIAGNOSIS — N939 Abnormal uterine and vaginal bleeding, unspecified: Secondary | ICD-10-CM | POA: Diagnosis not present

## 2017-07-26 DIAGNOSIS — R102 Pelvic and perineal pain: Secondary | ICD-10-CM | POA: Diagnosis not present

## 2017-07-26 DIAGNOSIS — B9689 Other specified bacterial agents as the cause of diseases classified elsewhere: Secondary | ICD-10-CM | POA: Diagnosis not present

## 2017-07-26 DIAGNOSIS — Z3491 Encounter for supervision of normal pregnancy, unspecified, first trimester: Secondary | ICD-10-CM

## 2017-07-26 DIAGNOSIS — N760 Acute vaginitis: Secondary | ICD-10-CM

## 2017-07-26 DIAGNOSIS — Z3401 Encounter for supervision of normal first pregnancy, first trimester: Secondary | ICD-10-CM

## 2017-07-26 LAB — POC MICROSCOPIC URINALYSIS (UMFC): Mucus: ABSENT

## 2017-07-26 LAB — POCT URINALYSIS DIP (MANUAL ENTRY)
BILIRUBIN UA: NEGATIVE
Blood, UA: NEGATIVE
GLUCOSE UA: NEGATIVE mg/dL
Ketones, POC UA: NEGATIVE mg/dL
NITRITE UA: NEGATIVE
Protein Ur, POC: NEGATIVE mg/dL
Spec Grav, UA: 1.01 (ref 1.010–1.025)
Urobilinogen, UA: 0.2 E.U./dL
pH, UA: 6 (ref 5.0–8.0)

## 2017-07-26 LAB — POCT WET + KOH PREP
Trich by wet prep: ABSENT
YEAST BY KOH: ABSENT
Yeast by wet prep: ABSENT

## 2017-07-26 MED ORDER — METOCLOPRAMIDE HCL 5 MG PO TABS: 5.0000 mg | ORAL_TABLET | Freq: Four times a day (QID) | ORAL | 1 refills | Status: AC | PRN

## 2017-07-26 MED ORDER — METRONIDAZOLE 500 MG PO TABS: 500.0000 mg | ORAL_TABLET | Freq: Two times a day (BID) | ORAL | 0 refills | Status: AC

## 2017-07-26 NOTE — Progress Notes (Addendum)
Subjective:  By signing my name below, I, Stann Ore, attest that this documentation has been prepared under the direction and in the presence of Norberto Sorenson, MD. Electronically Signed: Stann Ore, Scribe. 07/26/2017 , 1:13 PM .  Patient was seen in Room 2 .   Patient ID: Barbara Moody, female    DOB: Jan 30, 1991, 27 y.o.   MRN: [id number] Chief Complaint  Patient presents with  . FMLA Paperwork    Pt states she missed work Thu-Saturday of last week because nausea, vomiting, and cramping. No blood.    HPI Barbara Moody is a 27 y.o. female G1P0000 who presents to Primary Care at [facility] requests FMLA paperwork done for days missed due to evening nausea.  I saw her 8d prior and found her to be in the first trimester of preg with 1st d of LMP 06/13/17 so she was at 5 wks 0d at last visit and is at 6 wks 1d today by LMP.  This is her first pregnancy.   Patient had missed work last Thursday (5/23) through Saturday (5/25), with her shift going from 8:00 PM to 8:00 AM. 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 She's been having nausea with vomiting (pale yellow) and abdominal cramping, describes similar to morning sickness, but just occurring in the evening. She informs evening sickness occurs around 9-10 PM usually but always by 11:00 PM. If she is at home sleeping rather than work, the nausea will wake her up to have an immediate episode of emesis but she can usually return to sleep after vomiting. She feels better after drinking some fluids, and tried ginger with some minimal relief, otherwise sleeping well. She describes cramping more in her stomach and in the Right adnexal area. She denies any changes from last week. She noticed some vaginal spotting on Sunday (5/26), but none before or since. She's been able to eat and drink well during the day. She's been slightly constipated, drinking prune juice.  No GERD/heartburn.  Stopped her  sertraline 50 though I told her she could stay on it if she needed it - treated depression at less risk to fetus than med but pt felt she could stop it w/o detriment.   No past medical history on file. No past surgical history on file. Prior to Admission medications   Medication Sig Start Date End Date Taking? Authorizing Provider  sertraline (ZOLOFT) 50 MG tablet Take 1 tablet (50 mg total) by mouth daily. Patient not taking: Reported on 01/14/2016 01/04/15   Peyton Najjar, MD  valACYclovir (VALTREX) 500 MG tablet Take 1 tablet (500 mg total) by mouth daily. Increase to twice a day for 5 days at first sign of an outbreak Patient not taking: Reported on 07/18/2017 09/20/15   Sherren Mocha, MD   No Known Allergies No family history on file. Social History   Socioeconomic History  . Marital status: Single    Spouse name: Not on file  . Number of children: Not on file  . Years of education: Not on file  . Highest education level: Not on file  Occupational History  . Not on file  Social Needs  . Financial resource strain: Not on file  . Food insecurity:    Worry: Not on file    Inability: Not on file  . Transportation needs:    Medical: Not on file    Non-medical: Not on file  Tobacco Use  .  Smoking status: Never Smoker  . Smokeless tobacco: Never Used  Substance and Sexual Activity  . Alcohol use: No    Alcohol/week: 0.0 oz  . Drug use: No  . Sexual activity: Not on file  Lifestyle  . Physical activity:    Days per week: Not on file    Minutes per session: Not on file  . Stress: Not on file  Relationships  . Social connections:    Talks on phone: Not on file    Gets together: Not on file    Attends religious service: Not on file    Active member of club or organization: Not on file    Attends meetings of clubs or organizations: Not on file    Relationship status: Not on file  Other Topics Concern  . Not on file  Social History Narrative  . Not on file   Depression  screen [facility] 2/9 07/26/2017 07/18/2017 08/22/2016 09/20/2015 01/27/2015  Decreased Interest 0 0 0 0 0  Down, Depressed, Hopeless 0 0 0 0 0  PHQ - 2 Score 0 0 0 0 0    Review of Systems  Constitutional: Positive for activity change (decrease) and appetite change (decrease). Negative for chills, fatigue, fever and unexpected weight change.  HENT: Negative for sore throat and trouble swallowing.   Respiratory: Negative for cough.   Cardiovascular: Negative for palpitations.  Gastrointestinal: Positive for abdominal pain (cramping), nausea and vomiting. Negative for constipation and diarrhea.  Genitourinary: Positive for pelvic pain and vaginal bleeding. Negative for decreased urine volume, difficulty urinating, dyspareunia, dysuria, enuresis, flank pain, frequency, genital sores, hematuria, urgency and vaginal discharge.  Skin: Negative for rash and wound.  Neurological: Negative for dizziness, weakness and headaches.  Hematological: Negative for adenopathy.  Psychiatric/Behavioral: Negative for sleep disturbance.       Objective:   Physical Exam  Constitutional: She is oriented to person, place, and time. She appears well-developed and well-nourished. No distress.  HENT:  Head: Normocephalic and atraumatic.  Eyes: Pupils are equal, round, and reactive to light. EOM are normal.  Neck: Neck supple.  Cardiovascular: Normal rate.  Pulmonary/Chest: Effort normal. No respiratory distress.  Abdominal: Normal appearance and bowel sounds are normal. There is tenderness in the right lower quadrant and suprapubic area.  Genitourinary: Vagina normal. Pelvic exam was performed with patient supine. There is no rash, tenderness or lesion on the right labia. There is no rash, tenderness or lesion on the left labia. Uterus is not deviated, not fixed and not tender. Cervix exhibits no motion tenderness, no discharge and no friability. Right adnexum displays no mass, no tenderness and no fullness. Left adnexum  displays no mass, no tenderness and no fullness. No erythema, tenderness or bleeding in the vagina. No foreign body in the vagina.  Musculoskeletal: Normal range of motion.  Neurological: She is alert and oriented to person, place, and time.  Skin: Skin is warm and dry.  Psychiatric: She has a normal mood and affect. Her behavior is normal.  Nursing note and vitals reviewed.   BP 110/60 (BP Location: Left Arm, Patient Position: Sitting, Cuff Size: Normal)   Pulse 85   Temp 98.3 F (36.8 C) (Oral)   Resp 18   Ht  (1.549 m)   Wt 148 lb (67.1 kg)   LMP 06/13/2017   SpO2 100%   BMI 27.96 kg/m   Orthostatic VS for the past 24 hrs (Last 3 readings):  BP- Lying Pulse- Lying BP- Sitting Pulse- Sitting BP- Standing  at 0 minutes Pulse- Standing at 0 minutes  07/26/17 1246 115/68 66 119/72 80 113/77 85       Assessment & Plan:   1. Bilious vomiting with nausea - suspect normal n/v of first trimester - occurring in evening rather than a.m. Though pt works nights so hormonal system might be a little altered. Failed ginger, try B6, pretreat with reglan.  2. First trimester pregnancy - diagnosed after mult home + UPTs at visit last wk 5/22 where she was found to be at 5 wks 0d due to LMP of 06/13/17 - gestational age today 6 wks and 1d with EDD 03/20/2018. Has not established w/ OB yet but reviewed mult options/considerations at last visit - needs to call to sched initial appt somewhere  3. Vaginal spotting - pelvic exam nml, no cause for concern seen - could be implantation bleeding or some friability/irritation from BV. Reviewed risks and signs of miscarriage - no way to prevent but best to get good exercise, nutrition, rest, avoid smoke/secondhand smoke, etc.   4. Adnexal pain   5.      Bacterial vaginosis - on wet prep - might be contributing to spotting so will trx w/ flagyl 6.      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  Orders Placed This Encounter  Procedures  . GC/Chlamydia Probe Amp  . CBC with Differential/Platelet  . Comprehensive metabolic panel  . Lipase  . Beta hCG quant (ref lab)  . Orthostatic vital signs  . POCT urinalysis dipstick  . POCT Microscopic Urinalysis ([facility])  . POCT Wet + KOH Prep    Meds ordered this encounter  Medications  . metoCLOPramide (REGLAN) 5 MG tablet    Sig: Take 1 tablet (5 mg total) by mouth every 6 (six) hours as needed for nausea or vomiting.    Dispense:  40 tablet    Refill:  1  . metroNIDAZOLE (FLAGYL) 500 MG tablet    Sig: Take 1 tablet (500 mg total) by mouth 2 (two) times daily.    Dispense:  14 tablet    Refill:  0    I personally performed the services described in this documentation, which was scribed in my presence. The recorded information has been reviewed and considered, and addended by me as needed.   Norberto Sorenson, M.D.  Primary Care at [facility] [address] [place], [place] [address] [phone] phone [phone] fax  07/26/17 1:30 PM   Results for orders placed or performed in visit on 07/26/17  GC/Chlamydia Probe Amp  Result Value Ref Range   Chlamydia trachomatis, NAA Negative Negative   Neisseria gonorrhoeae by PCR Negative Negative  CBC with Differential/Platelet  Result Value Ref Range   WBC 5.2 3.4 - 10.8 x10E3/uL   RBC 4.54 3.77 - 5.28 x10E6/uL   Hemoglobin 11.2 11.1 - 15.9 g/dL   Hematocrit 13.0 86.5 - 46.6 %   MCV 80 79 - 97 fL   MCH 24.7 (L) 26.6 - 33.0 pg   MCHC 30.8 (L) 31.5 - 35.7 g/dL   RDW 78.4 69.6 - 29.5 %   Platelets 209 150 - 450 x10E3/uL   Neutrophils 44 Not Estab. %   Lymphs 43 Not Estab. %   Monocytes 6 Not Estab. %   Eos 6  Not Estab. %   Basos 1 Not Estab. %   Neutrophils Absolute 2.4 1.4 - 7.0 x10E3/uL   Lymphocytes Absolute 2.2 0.7 - 3.1 x10E3/uL   Monocytes Absolute 0.3 0.1 - 0.9 x10E3/uL   EOS  (ABSOLUTE) 0.3 0.0 - 0.4 x10E3/uL   Basophils Absolute 0.0 0.0 - 0.2 x10E3/uL   Immature Granulocytes 0 Not Estab. %   Immature Grans (Abs) 0.0 0.0 - 0.1 x10E3/uL  Comprehensive metabolic panel  Result Value Ref Range   Glucose 84 65 - 99 mg/dL   BUN 8 6 - 20 mg/dL   Creatinine, Ser 9.56 0.57 - 1.00 mg/dL   GFR calc non Af Amer 120 >59 mL/min/1.73   GFR calc Af Amer 138 >59 mL/min/1.73   BUN/Creatinine Ratio 12 9 - 23   Sodium 134 134 - 144 mmol/L   Potassium 4.2 3.5 - 5.2 mmol/L   Chloride 104 96 - 106 mmol/L   CO2 18 (L) 20 - 29 mmol/L   Calcium 9.6 8.7 - 10.2 mg/dL   Total Protein 7.2 6.0 - 8.5 g/dL   Albumin 4.4 3.5 - 5.5 g/dL   Globulin, Total 2.8 1.5 - 4.5 g/dL   Albumin/Globulin Ratio 1.6 1.2 - 2.2   Bilirubin Total 0.2 0.0 - 1.2 mg/dL   Alkaline Phosphatase 45 39 - 117 IU/L   AST 16 0 - 40 IU/L   ALT 21 0 - 32 IU/L  Lipase  Result Value Ref Range   Lipase 30 14 - 72 U/L  Beta hCG quant (ref lab)  Result Value Ref Range   hCG Quant 9,253 mIU/mL  POCT urinalysis dipstick  Result Value Ref Range   Color, UA yellow yellow   Clarity, UA clear clear   Glucose, UA negative negative mg/dL   Bilirubin, UA negative negative   Ketones, POC UA negative negative mg/dL   Spec Grav, UA 2.130 8.657 - 1.025   Blood, UA negative negative   pH, UA 6.0 5.0 - 8.0   Protein Ur, POC negative negative mg/dL   Urobilinogen, UA 0.2 0.2 or 1.0 E.U./dL   Nitrite, UA Negative Negative   Leukocytes, UA Trace (A) Negative  POCT Microscopic Urinalysis ([facility])  Result Value Ref Range   WBC,UR,HPF,POC None None WBC/hpf   RBC,UR,HPF,POC None None RBC/hpf   Bacteria None None, Too numerous to count   Mucus Absent Absent   Epithelial Cells, UR Per Microscopy Few (A) None, Too numerous to count cells/hpf  POCT Wet + KOH Prep  Result Value Ref Range   Yeast by KOH Absent Absent   Yeast by wet prep Absent Absent   WBC by wet prep Many (A) Few   Clue Cells Wet Prep HPF POC Moderate (A) None    Trich by wet prep Absent Absent   Bacteria Wet Prep HPF POC Few Few   Epithelial Cells By [employer] ([facility]) Many (A) None, Few, Too numerous to count   RBC,UR,HPF,POC None None RBC/hpf

## 2017-07-26 NOTE — Patient Instructions (Addendum)
IF you received an x-ray today, you will receive an invoice from [facility]. Please contact [facility] at [phone] with questions or concerns regarding your invoice.   IF you received labwork today, you will receive an invoice from [place]. Please contact [facility] at [phone] with questions or concerns regarding your invoice.   Our billing staff will not be able to assist you with questions regarding bills from these companies.  You will be contacted with the lab results as soon as they are available. The fastest way to get your results is to activate your My Chart account. Instructions are located on the last page of this paperwork. If you have not heard from [place] regarding the results in 2 weeks, please contact this office.     Morning Sickness Morning sickness is when you feel sick to your stomach (nauseous) during pregnancy. This nauseous feeling may or may not come with vomiting. It often occurs in the morning but can be a problem any time of day. Morning sickness is most common during the first trimester, but it may continue throughout pregnancy. While morning sickness is unpleasant, it is usually harmless unless you develop severe and continual vomiting (hyperemesis gravidarum). This condition requires more intense treatment. What are the causes? The cause of morning sickness is not completely known but seems to be related to normal hormonal changes that occur in pregnancy. What increases the risk? You are at greater risk if you:  Experienced nausea or vomiting before your pregnancy.  Had morning sickness during a previous pregnancy.  Are pregnant with more than one baby, such as twins.  How is this treated? Do not use any medicines (prescription, over-the-counter, or herbal) for morning sickness without first talking to your health care provider. Your health care provider may prescribe or recommend:  Vitamin B6 supplements.  Anti-nausea  medicines.  The herbal medicine ginger.  Follow these instructions at home:  Only take over-the-counter or prescription medicines as directed by your health care provider.  Taking multivitamins before getting pregnant can prevent or decrease the severity of morning sickness in most women.  Eat a piece of dry toast or unsalted crackers before getting out of bed in the morning.  Eat five or six small meals a day.  Eat dry and bland foods (rice, baked potato). Foods high in carbohydrates are often helpful.  Do not drink liquids with your meals. Drink liquids between meals.  Avoid greasy, fatty, and spicy foods.  Get someone to cook for you if the smell of any food causes nausea and vomiting.  If you feel nauseous after taking prenatal vitamins, take the vitamins at night or with a snack.  Snack on protein foods (nuts, yogurt, cheese) between meals if you are hungry.  Eat unsweetened gelatins for desserts.  Wearing an acupressure wristband (worn for sea sickness) may be helpful.  Acupuncture may be helpful.  Do not smoke.  Get a humidifier to keep the air in your house free of odors.  Get plenty of fresh air. Contact a health care provider if:  Your home remedies are not working, and you need medicine.  You feel dizzy or lightheaded.  You are losing weight. Get help right away if:  You have persistent and uncontrolled nausea and vomiting.  You pass out (faint). This information is not intended to replace advice given to you by your health care provider. Make sure you discuss any questions you have with your health care provider. Document Released: 04/06/2006 Document  Revised: 07/22/2015 Document Reviewed: 07/31/2012 Elsevier Interactive Patient Education  2017 Elsevier Inc.  Bacterial Vaginosis Bacterial vaginosis is a vaginal infection that occurs when the normal balance of bacteria in the vagina is disrupted. It results from an overgrowth of certain bacteria. This  is the most common vaginal infection among women ages 7-44. Because bacterial vaginosis increases your risk for STIs (sexually transmitted infections), getting treated can help reduce your risk for chlamydia, gonorrhea, herpes, and HIV (human immunodeficiency virus). Treatment is also important for preventing complications in pregnant women, because this condition can cause an early (premature) delivery. What are the causes? This condition is caused by an increase in harmful bacteria that are normally present in small amounts in the vagina. However, the reason that the condition develops is not fully understood. What increases the risk? The following factors may make you more likely to develop this condition:  Having a new sexual partner or multiple sexual partners.  Having unprotected sex.  Douching.  Having an intrauterine device (IUD).  Smoking.  Drug and alcohol abuse.  Taking certain antibiotic medicines.  Being pregnant.  You cannot get bacterial vaginosis from toilet seats, bedding, swimming pools, or contact with objects around you. What are the signs or symptoms? Symptoms of this condition include:  Grey or white vaginal discharge. The discharge can also be watery or foamy.  A fish-like odor with discharge, especially after sexual intercourse or during menstruation.  Itching in and around the vagina.  Burning or pain with urination.  Some women with bacterial vaginosis have no signs or symptoms. How is this diagnosed? This condition is diagnosed based on:  Your medical history.  A physical exam of the vagina.  Testing a sample of vaginal fluid under a microscope to look for a large amount of bad bacteria or abnormal cells. Your health care provider may use a cotton swab or a small wooden spatula to collect the sample.  How is this treated? This condition is treated with antibiotics. These may be given as a pill, a vaginal cream, or a medicine that is put into  the vagina (suppository). If the condition comes back after treatment, a second round of antibiotics may be needed. Follow these instructions at home: Medicines  Take over-the-counter and prescription medicines only as told by your health care provider.  Take or use your antibiotic as told by your health care provider. Do not stop taking or using the antibiotic even if you start to feel better. General instructions  If you have a female sexual partner, tell her that you have a vaginal infection. She should see her health care provider and be treated if she has symptoms. If you have a female sexual partner, he does not need treatment.  During treatment: ? Avoid sexual activity until you finish treatment. ? Do not douche. ? Avoid alcohol as directed by your health care provider. ? Avoid breastfeeding as directed by your health care provider.  Drink enough water and fluids to keep your urine clear or pale yellow.  Keep the area around your vagina and rectum clean. ? Wash the area daily with warm water. ? Wipe yourself from front to back after using the toilet.  Keep all follow-up visits as told by your health care provider. This is important. How is this prevented?  Do not douche.  Wash the outside of your vagina with warm water only.  Use protection when having sex. This includes latex condoms and dental dams.  Limit how many sexual partners  you have. To help prevent bacterial vaginosis, it is best to have sex with just one partner (monogamous).  Make sure you and your sexual partner are tested for STIs.  Wear cotton or cotton-lined underwear.  Avoid wearing tight pants and pantyhose, especially during summer.  Limit the amount of alcohol that you drink.  Do not use any products that contain nicotine or tobacco, such as cigarettes and e-cigarettes. If you need help quitting, ask your health care provider.  Do not use illegal drugs. Where to find more information:  Centers  for Disease Control and Prevention: SolutionApps.co.za  American Sexual Health Association (ASHA): www.ashastd.org  U.S. Department of Health and Health and safety inspector, Office on Women's Health: ConventionalMedicines.si or http://www.anderson-williamson.info/ Contact a health care provider if:  Your symptoms do not improve, even after treatment.  You have more discharge or pain when urinating.  You have a fever.  You have pain in your abdomen.  You have pain during sex.  You have vaginal bleeding between periods. Summary  Bacterial vaginosis is a vaginal infection that occurs when the normal balance of bacteria in the vagina is disrupted.  Because bacterial vaginosis increases your risk for STIs (sexually transmitted infections), getting treated can help reduce your risk for chlamydia, gonorrhea, herpes, and HIV (human immunodeficiency virus). Treatment is also important for preventing complications in pregnant women, because the condition can cause an early (premature) delivery.  This condition is treated with antibiotic medicines. These may be given as a pill, a vaginal cream, or a medicine that is put into the vagina (suppository). This information is not intended to replace advice given to you by your health care provider. Make sure you discuss any questions you have with your health care provider. Document Released: 02/13/2005 Document Revised: 06/19/2016 Document Reviewed: 10/30/2015 Elsevier Interactive Patient Education  [employer].

## 2017-07-27 LAB — COMPREHENSIVE METABOLIC PANEL
ALBUMIN: 4.4 g/dL (ref 3.5–5.5)
ALT: 21 IU/L (ref 0–32)
AST: 16 IU/L (ref 0–40)
Albumin/Globulin Ratio: 1.6 (ref 1.2–2.2)
Alkaline Phosphatase: 45 IU/L (ref 39–117)
BUN / CREAT RATIO: 12 (ref 9–23)
BUN: 8 mg/dL (ref 6–20)
Bilirubin Total: 0.2 mg/dL (ref 0.0–1.2)
CALCIUM: 9.6 mg/dL (ref 8.7–10.2)
CO2: 18 mmol/L — ABNORMAL LOW (ref 20–29)
Chloride: 104 mmol/L (ref 96–106)
Creatinine, Ser: 0.69 mg/dL (ref 0.57–1.00)
GFR calc non Af Amer: 120 mL/min/{1.73_m2} (ref >59)
GFR, EST AFRICAN AMERICAN: 138 mL/min/{1.73_m2} (ref >59)
GLUCOSE: 84 mg/dL (ref 65–99)
Globulin, Total: 2.8 g/dL (ref 1.5–4.5)
Potassium: 4.2 mmol/L (ref 3.5–5.2)
Sodium: 134 mmol/L (ref 134–144)
TOTAL PROTEIN: 7.2 g/dL (ref 6.0–8.5)

## 2017-07-27 LAB — CBC WITH DIFFERENTIAL/PLATELET
BASOS ABS: 0 10*3/uL (ref 0.0–0.2)
Basos: 1 %
EOS (ABSOLUTE): 0.3 10*3/uL (ref 0.0–0.4)
Eos: 6 %
HEMOGLOBIN: 11.2 g/dL (ref 11.1–15.9)
Hematocrit: 36.4 % (ref 34.0–46.6)
IMMATURE GRANS (ABS): 0 10*3/uL (ref 0.0–0.1)
IMMATURE GRANULOCYTES: 0 %
LYMPHS: 43 %
Lymphocytes Absolute: 2.2 10*3/uL (ref 0.7–3.1)
MCH: 24.7 pg — ABNORMAL LOW (ref 26.6–33.0)
MCHC: 30.8 g/dL — ABNORMAL LOW (ref 31.5–35.7)
MCV: 80 fL (ref 79–97)
MONOCYTES: 6 %
Monocytes Absolute: 0.3 10*3/uL (ref 0.1–0.9)
NEUTROS ABS: 2.4 10*3/uL (ref 1.4–7.0)
Neutrophils: 44 %
Platelets: 209 10*3/uL (ref 150–450)
RBC: 4.54 x10E6/uL (ref 3.77–5.28)
RDW: 14.6 % (ref 12.3–15.4)
WBC: 5.2 10*3/uL (ref 3.4–10.8)

## 2017-07-27 LAB — BETA HCG QUANT (REF LAB): HCG QUANT: 9253 m[IU]/mL

## 2017-07-27 LAB — LIPASE: Lipase: 30 U/L (ref 14–72)

## 2017-07-28 LAB — GC/CHLAMYDIA PROBE AMP
Chlamydia trachomatis, NAA: NEGATIVE
Neisseria gonorrhoeae by PCR: NEGATIVE

## 2017-08-11 DIAGNOSIS — A6000 Herpesviral infection of urogenital system, unspecified: Secondary | ICD-10-CM | POA: Insufficient documentation

## 2017-08-28 DIAGNOSIS — Z0271 Encounter for disability determination: Secondary | ICD-10-CM

## 2017-12-13 ENCOUNTER — Other Ambulatory Visit (HOSPITAL_COMMUNITY): Payer: Self-pay | Admitting: Certified Nurse Midwife

## 2017-12-13 DIAGNOSIS — Z3689 Encounter for other specified antenatal screening: Secondary | ICD-10-CM

## 2017-12-18 ENCOUNTER — Ambulatory Visit (HOSPITAL_COMMUNITY)
Admission: RE | Admit: 2017-12-18 | Discharge: 2017-12-18 | Disposition: A | Discharge status: Home / Self Care | Payer: BLUE CROSS/BLUE SHIELD | Source: Ambulatory Visit | Attending: Certified Nurse Midwife | Admitting: Certified Nurse Midwife

## 2017-12-18 DIAGNOSIS — Z3689 Encounter for other specified antenatal screening: Secondary | ICD-10-CM

## 2017-12-18 DIAGNOSIS — O0933 Supervision of pregnancy with insufficient antenatal care, third trimester: Secondary | ICD-10-CM | POA: Diagnosis not present

## 2017-12-18 DIAGNOSIS — Z363 Encounter for antenatal screening for malformations: Secondary | ICD-10-CM | POA: Diagnosis not present

## 2017-12-18 DIAGNOSIS — Z3A26 26 weeks gestation of pregnancy: Secondary | ICD-10-CM | POA: Insufficient documentation

## 2018-01-28 ENCOUNTER — Encounter: Payer: Self-pay | Admitting: Emergency Medicine

## 2018-01-28 ENCOUNTER — Other Ambulatory Visit: Payer: Self-pay

## 2018-01-28 ENCOUNTER — Ambulatory Visit (INDEPENDENT_AMBULATORY_CARE_PROVIDER_SITE_OTHER): Payer: BLUE CROSS/BLUE SHIELD | Admitting: Emergency Medicine

## 2018-01-28 NOTE — Patient Instructions (Signed)
° ° ° °  If you have lab work done today you will be contacted with your lab results within the next 2 weeks.  If you have not heard from us then please contact us. The fastest way to get your results is to register for My Chart. ° ° °IF you received an x-ray today, you will receive an invoice from [facility]. Please contact [facility] at [phone] with questions or concerns regarding your invoice.  ° °IF you received labwork today, you will receive an invoice from [facility]. Please contact [facility] at [phone] with questions or concerns regarding your invoice.  ° °Our billing staff will not be able to assist you with questions regarding bills from these companies. ° °You will be contacted with the lab results as soon as they are available. The fastest way to get your results is to activate your My Chart account. Instructions are located on the last page of this paperwork. If you have not heard from us regarding the results in 2 weeks, please contact this office. °  ° ° ° °

## 2018-01-28 NOTE — Progress Notes (Deleted)
Patient left.  Not seen by me.

## 2018-02-01 NOTE — Progress Notes (Signed)
Not seen by provider
